# Patient Record
Sex: Female | Born: 1995 | Hispanic: Yes | Marital: Married | Smoking: Never smoker
Health system: Southern US, Community
[De-identification: ages and names within clinical notes are randomized; demographics above are authoritative.]

## PROBLEM LIST (undated history)

## (undated) DIAGNOSIS — O24419 Gestational diabetes mellitus in pregnancy, unspecified control: Secondary | ICD-10-CM

## (undated) DIAGNOSIS — Z789 Other specified health status: Secondary | ICD-10-CM

## (undated) DIAGNOSIS — E119 Type 2 diabetes mellitus without complications: Secondary | ICD-10-CM

## (undated) HISTORY — DX: Gestational diabetes mellitus in pregnancy, unspecified control: O24.419

## (undated) HISTORY — DX: Type 2 diabetes mellitus without complications: E11.9

## (undated) HISTORY — PX: NO PAST SURGERIES: SHX2092

## (undated) HISTORY — DX: Other specified health status: Z78.9

---

## 2018-05-26 LAB — OB RESULTS CONSOLE RUBELLA ANTIBODY, IGM: Rubella: IMMUNE

## 2018-05-26 LAB — OB RESULTS CONSOLE HIV ANTIBODY (ROUTINE TESTING): HIV: NONREACTIVE

## 2018-05-26 LAB — OB RESULTS CONSOLE RPR: RPR: NONREACTIVE

## 2018-05-26 LAB — OB RESULTS CONSOLE HEPATITIS B SURFACE ANTIGEN: HEP B S AG: NEGATIVE

## 2018-05-28 LAB — OB RESULTS CONSOLE GC/CHLAMYDIA
Chlamydia: NEGATIVE
Gonorrhea: NEGATIVE

## 2018-06-24 DIAGNOSIS — Z3401 Encounter for supervision of normal first pregnancy, first trimester: Secondary | ICD-10-CM | POA: Diagnosis not present

## 2018-06-24 DIAGNOSIS — Z1389 Encounter for screening for other disorder: Secondary | ICD-10-CM | POA: Diagnosis not present

## 2018-07-08 DIAGNOSIS — Z3401 Encounter for supervision of normal first pregnancy, first trimester: Secondary | ICD-10-CM | POA: Diagnosis not present

## 2018-12-14 NOTE — L&D Delivery Note (Signed)
Delivery Note  Date of delivery: 01/25/2019 Estimated Date of Delivery: 01/16/19 Patient's last menstrual period was 04/13/2018 (approximate). EGA: 2921w2d  First Stage: Induction: pitocin, cook baloon Augmentation: AROM Analgesia Katrina Hester: IVPM, epidrual AROM at 1300 01/24/2019  Katrina PaganiniJennifer Hester presented to L&D for induction of labor of late term pregnancy. She was induced with pitocin and a Cook balloon, then augmented with AROM. Epidural placed for pain relief.   Second Stage: Complete dilation at 0228 Onset of pushing at 0343 FHR second stage: category II Delivery at 0533 on 01/25/2019  She progressed to complete and had a spontaneous vaginal birth of a live female over an intact perineum. The fetal head was delivered in direct OA position with restitution to ROA. No nuchal cord. Anterior then posterior shoulders delivered with minimal assistance. Baby placed on mom's abdomen and attended to by transition RN. Cord clamped and cut after two minute delay by father of the baby. Cord blood obtained for newborn labs.  Third Stage: Placenta delivered spontaneously intact with 3VC at 0539 Placenta disposition: routine disposal Uterine tone firm / bleeding scant IV pitocin given for hemorrhage prophylaxis  2nd degree perineal and left labial laceration identified  Anesthesia for repair: epidural, lidocaine Repair: 3-0 Vicryl Rapide CT, 3-0 Vicryl SH Est. Blood Loss (mL): 750  Complications: none  Mom to postpartum.  Baby to Couplet care / Skin to Skin.  Newborn: Birth Weight: pending  Apgar Scores: 8, 9 Feeding planned: breast   Genia DelMargaret Greely Hester, CNM 01/25/2019 6:28 AM

## 2019-01-23 ENCOUNTER — Observation Stay
Admission: EM | Admit: 2019-01-23 | Discharge: 2019-01-23 | Disposition: A | Discharge status: Home / Self Care | Payer: Medicaid Other | Attending: Obstetrics and Gynecology | Admitting: Obstetrics and Gynecology

## 2019-01-23 ENCOUNTER — Encounter: Payer: Self-pay | Admitting: *Deleted

## 2019-01-23 DIAGNOSIS — Z3A41 41 weeks gestation of pregnancy: Secondary | ICD-10-CM | POA: Diagnosis not present

## 2019-01-23 DIAGNOSIS — Z3490 Encounter for supervision of normal pregnancy, unspecified, unspecified trimester: Secondary | ICD-10-CM

## 2019-01-23 DIAGNOSIS — O471 False labor at or after 37 completed weeks of gestation: Secondary | ICD-10-CM | POA: Diagnosis present

## 2019-01-23 DIAGNOSIS — O480 Post-term pregnancy: Secondary | ICD-10-CM | POA: Diagnosis not present

## 2019-01-23 NOTE — Discharge Instructions (Signed)
Return on 01/24/2019 at 0001 for IOL.

## 2019-01-23 NOTE — Discharge Summary (Signed)
41 +0 week  Presented with uterine contractions . Scheduled for induction in next 24 hrs. Pt cx was 2 cm  And ctx q 2-7 min . Reassuring fetal monitoring . D/C home

## 2019-01-23 NOTE — OB Triage Note (Signed)
Pt arrived from home with complaints of contractions all day and leaking fluid that started about 30 min ago. Pt recieves prenatal care at [facility] and is scheduled for IOL on Tuesday 2/11. Pt reports feeling baby move and denies any vaginal bleeding. EFM and TOCO applied.

## 2019-01-23 NOTE — Discharge Summary (Signed)
nst FHR 150 + accels , no decels . Ctx q 2-7 min  Reactive NST

## 2019-01-23 NOTE — Progress Notes (Signed)
Dr Feliberto Gottron notified of pt arrival to unit and complaint. MD aware of reactive FHR tracing and contraction pattern. MD notified of SVE. Orders received to continue to monitor pt for an hour and re check cervix, if no change pt may be discharged to return for IOL in 24 hour

## 2019-01-24 ENCOUNTER — Inpatient Hospital Stay: Payer: Medicaid Other | Admitting: Anesthesiology

## 2019-01-24 ENCOUNTER — Inpatient Hospital Stay
Admission: RE | Admit: 2019-01-24 | Discharge: 2019-01-27 | DRG: 806 | Disposition: A | Discharge status: Home / Self Care | Payer: Medicaid Other | Attending: Certified Nurse Midwife | Admitting: Certified Nurse Midwife

## 2019-01-24 ENCOUNTER — Other Ambulatory Visit: Payer: Self-pay

## 2019-01-24 DIAGNOSIS — O480 Post-term pregnancy: Principal | ICD-10-CM | POA: Diagnosis present

## 2019-01-24 DIAGNOSIS — D62 Acute posthemorrhagic anemia: Secondary | ICD-10-CM | POA: Diagnosis not present

## 2019-01-24 DIAGNOSIS — O43123 Velamentous insertion of umbilical cord, third trimester: Secondary | ICD-10-CM | POA: Diagnosis present

## 2019-01-24 DIAGNOSIS — Z3A41 41 weeks gestation of pregnancy: Secondary | ICD-10-CM

## 2019-01-24 DIAGNOSIS — O9912 Other diseases of the blood and blood-forming organs and certain disorders involving the immune mechanism complicating childbirth: Secondary | ICD-10-CM | POA: Diagnosis present

## 2019-01-24 DIAGNOSIS — Z3493 Encounter for supervision of normal pregnancy, unspecified, third trimester: Secondary | ICD-10-CM

## 2019-01-24 DIAGNOSIS — D72829 Elevated white blood cell count, unspecified: Secondary | ICD-10-CM | POA: Diagnosis present

## 2019-01-24 DIAGNOSIS — O9081 Anemia of the puerperium: Secondary | ICD-10-CM | POA: Diagnosis not present

## 2019-01-24 LAB — CBC
HEMATOCRIT: 32.2 % — AB (ref 36.0–46.0)
Hemoglobin: 10.7 g/dL — ABNORMAL LOW (ref 12.0–15.0)
MCH: 30.7 pg (ref 26.0–34.0)
MCHC: 33.2 g/dL (ref 30.0–36.0)
MCV: 92.5 fL (ref 80.0–100.0)
Platelets: 200 10*3/uL (ref 150–400)
RBC: 3.48 MIL/uL — ABNORMAL LOW (ref 3.87–5.11)
RDW: 13.2 % (ref 11.5–15.5)
WBC: 9.6 10*3/uL (ref 4.0–10.5)
nRBC: 0 % (ref 0.0–0.2)

## 2019-01-24 LAB — OB RESULTS CONSOLE GBS: GBS: NEGATIVE

## 2019-01-24 LAB — RAPID HIV SCREEN (HIV 1/2 AB+AG)
HIV 1/2 Antibodies: NONREACTIVE
HIV-1 P24 Antigen - HIV24: NONREACTIVE

## 2019-01-24 LAB — TYPE AND SCREEN
ABO/RH(D): O POS
ANTIBODY SCREEN: NEGATIVE

## 2019-01-24 LAB — OB RESULTS CONSOLE HIV ANTIBODY (ROUTINE TESTING): HIV: NONREACTIVE

## 2019-01-24 LAB — GROUP B STREP BY PCR: Group B strep by PCR: NEGATIVE

## 2019-01-24 MED ORDER — OXYTOCIN 40 UNITS IN NORMAL SALINE INFUSION - SIMPLE MED
2.5000 [IU]/h | INTRAVENOUS | Status: DC
  Administered 2019-01-25: 2.5 [IU]/h via INTRAVENOUS
  Filled 2019-01-24: qty 1000

## 2019-01-24 MED ORDER — AMMONIA AROMATIC IN INHA
RESPIRATORY_TRACT | Status: AC
  Filled 2019-01-24: qty 10

## 2019-01-24 MED ORDER — LIDOCAINE HCL (PF) 1 % IJ SOLN
30.0000 mL | INTRAMUSCULAR | Status: DC | PRN
  Administered 2019-01-25: 30 mL via SUBCUTANEOUS

## 2019-01-24 MED ORDER — SOD CITRATE-CITRIC ACID 500-334 MG/5ML PO SOLN: 30.0000 mL | ORAL | Status: DC | PRN

## 2019-01-24 MED ORDER — OXYTOCIN 40 UNITS IN NORMAL SALINE INFUSION - SIMPLE MED
1.0000 m[IU]/min | INTRAVENOUS | Status: DC
  Administered 2019-01-24: 2 m[IU]/min via INTRAVENOUS
  Filled 2019-01-24: qty 1000

## 2019-01-24 MED ORDER — FENTANYL 2.5 MCG/ML W/ROPIVACAINE 0.15% IN NS 100 ML EPIDURAL (ARMC)
EPIDURAL | Status: DC | PRN
  Administered 2019-01-24: 12 mL/h via EPIDURAL

## 2019-01-24 MED ORDER — LIDOCAINE HCL (PF) 1 % IJ SOLN
INTRAMUSCULAR | Status: DC | PRN
  Administered 2019-01-24: 3 mL

## 2019-01-24 MED ORDER — MISOPROSTOL 200 MCG PO TABS
ORAL_TABLET | ORAL | Status: AC
  Filled 2019-01-24: qty 4

## 2019-01-24 MED ORDER — OXYTOCIN BOLUS FROM INFUSION
500.0000 mL | Freq: Once | INTRAVENOUS | Status: AC
  Administered 2019-01-25: 500 mL via INTRAVENOUS

## 2019-01-24 MED ORDER — LIDOCAINE-EPINEPHRINE (PF) 1.5 %-1:200000 IJ SOLN
INTRAMUSCULAR | Status: DC | PRN
  Administered 2019-01-24: 3 mL via PERINEURAL

## 2019-01-24 MED ORDER — OXYTOCIN 10 UNIT/ML IJ SOLN
INTRAMUSCULAR | Status: AC
  Filled 2019-01-24: qty 2

## 2019-01-24 MED ORDER — LACTATED RINGERS IV SOLN
500.0000 mL | INTRAVENOUS | Status: DC | PRN
  Administered 2019-01-24: 500 mL via INTRAVENOUS

## 2019-01-24 MED ORDER — ACETAMINOPHEN 325 MG PO TABS: 650.0000 mg | ORAL_TABLET | ORAL | Status: DC | PRN

## 2019-01-24 MED ORDER — LIDOCAINE HCL (PF) 1 % IJ SOLN
INTRAMUSCULAR | Status: AC
  Administered 2019-01-25: 30 mL via SUBCUTANEOUS
  Filled 2019-01-24: qty 30

## 2019-01-24 MED ORDER — FENTANYL 2.5 MCG/ML W/ROPIVACAINE 0.15% IN NS 100 ML EPIDURAL (ARMC)
EPIDURAL | Status: AC
  Filled 2019-01-24: qty 100

## 2019-01-24 MED ORDER — BUPIVACAINE HCL (PF) 0.25 % IJ SOLN
INTRAMUSCULAR | Status: DC | PRN
  Administered 2019-01-24: 5 mL via EPIDURAL
  Administered 2019-01-24 (×2): 4 mL via EPIDURAL
  Administered 2019-01-24: 5 mL via EPIDURAL

## 2019-01-24 MED ORDER — BUTORPHANOL TARTRATE 1 MG/ML IJ SOLN
1.0000 mg | INTRAMUSCULAR | Status: DC | PRN
  Administered 2019-01-24 (×2): 1 mg via INTRAVENOUS
  Filled 2019-01-24 (×2): qty 1

## 2019-01-24 MED ORDER — TERBUTALINE SULFATE 1 MG/ML IJ SOLN: 0.2500 mg | Freq: Once | INTRAMUSCULAR | Status: DC | PRN

## 2019-01-24 MED ORDER — LACTATED RINGERS IV SOLN
INTRAVENOUS | Status: DC
  Administered 2019-01-24 (×2): via INTRAVENOUS

## 2019-01-24 MED ORDER — ONDANSETRON HCL 4 MG/2ML IJ SOLN
4.0000 mg | Freq: Four times a day (QID) | INTRAMUSCULAR | Status: DC | PRN
  Administered 2019-01-25: 4 mg via INTRAVENOUS
  Filled 2019-01-24: qty 2

## 2019-01-24 NOTE — Anesthesia Preprocedure Evaluation (Signed)
Anesthesia Evaluation  Patient identified by MRN, date of birth, ID band Patient awake    Reviewed: Allergy & Precautions, H&P , NPO status , Patient's Chart, lab work & pertinent test results  History of Anesthesia Complications Negative for: history of anesthetic complications  Airway Mallampati: II  TM Distance: >3 FB Neck ROM: full    Dental no notable dental hx.    Pulmonary neg pulmonary ROS,    Pulmonary exam normal        Cardiovascular negative cardio ROS Normal cardiovascular exam     Neuro/Psych negative neurological ROS  negative psych ROS   GI/Hepatic negative GI ROS, Neg liver ROS,   Endo/Other  negative endocrine ROS  Renal/GU negative Renal ROS  negative genitourinary   Musculoskeletal   Abdominal   Peds  Hematology negative hematology ROS (+)   Anesthesia Other Findings   Reproductive/Obstetrics (+) Pregnancy                             Anesthesia Physical Anesthesia Plan  ASA: II  Anesthesia Plan: Epidural   Post-op Pain Management:    Induction:   PONV Risk Score and Plan:   Airway Management Planned:   Additional Equipment:   Intra-op Plan:   Post-operative Plan:   Informed Consent: I have reviewed the patients History and Physical, chart, labs and discussed the procedure including the risks, benefits and alternatives for the proposed anesthesia with the patient or authorized representative who has indicated his/her understanding and acceptance.       Plan Discussed with: Anesthesiologist  Anesthesia Plan Comments:         Anesthesia Quick Evaluation  

## 2019-01-24 NOTE — Plan of Care (Signed)
Pt presents from home with plan for induction of labor at 41/1. Pt is in agreement at this time. Family and spouse at bedside for support. Expecting baby girl "Sophia".

## 2019-01-24 NOTE — Progress Notes (Signed)
   01/24/19 1000  Clinical Encounter Type  Visited With Patient and family together  Visit Type Initial;Spiritual support  Referral From Chaplain  Consult/Referral To Chaplain  Spiritual Encounters  Spiritual Needs Emotional  Chaplain was rounding and stop to visit patient, she introduced herself to patient and boyfriend Jari Favre. There were other family at bedside asleep. Chaplain told patient she just want to stop and wish her well on her delivery and prays everything goes well and be as confronting as possible. Patient really appreciated the visit and thanked the Chaplain. Chaplain said your welcomed and left.

## 2019-01-24 NOTE — H&P (Signed)
OB History & Physical   History of Present Illness:  Chief Complaint: Presents for IOL  HPI:  Katrina Hester is a 23 y.o. G1P0 female at [redacted]w[redacted]d dated by [redacted]w[redacted]d ultrasound.  She presents to L&D for planned induction for late term pregnancy. Prenatal records were NOT available and the writing of this note.   She reports:  -active fetal movement -no leakage of fluid -no vaginal bleeding  Pregnancy Issues: Records not available   Maternal Medical History:  History reviewed. No pertinent past medical history.  History reviewed. No pertinent surgical history.  No Known Allergies  Prior to Admission medications   Medication Sig Start Date End Date Taking? Authorizing Provider  prenatal vitamin w/FE, FA (PRENATAL 1 + 1) 27-1 MG TABS tablet Take 1 tablet by mouth daily at 12 noon.   Yes [provider]     Prenatal care site: [facility]  Social History: She  reports that she has never smoked. She has never used smokeless tobacco. She reports that she does not drink alcohol or use drugs.  Family History: family history is not on file.   Review of Systems: A full review of systems was performed and negative except as noted in the HPI.    Physical Exam:  Vital Signs: BP (!) 110/57 (BP Location: Left Arm)   Pulse 77   Temp 98 F (36.7 C) (Oral)   Resp 18   Ht 5\' 1"  (1.549 m)   Wt 72.1 kg   LMP 04/13/2018 (Approximate)   BMI 30.04 kg/m   General:   alert, cooperative, appears stated age and no distress  Skin:  normal and no rash or abnormalities  Neurologic:    Alert & oriented x 3  Lungs:   clear to auscultation bilaterally  Heart:   regular rate and rhythm, S1, S2 normal, no murmur, click, rub or gallop  Abdomen:  soft, non-tender; bowel sounds normal; no masses,  no organomegaly  Pelvis:  External genitalia: normal general appearance  FHT:  140 BPM  Presentations: cephalic  Cervix:     Dilation: 3cm   Effacement: 70%   Station:  -2   Position: posterior  Extremities: : non-tender, symmetric, no edema bilaterally.    EFW: 8lb  Results for orders placed or performed during the hospital encounter of 01/24/19 (from the past 24 hour(s))  CBC     Status: Abnormal   Collection Time: 01/24/19 12:53 AM  Result Value Ref Range   WBC 9.6 4.0 - 10.5 K/uL   RBC 3.48 (L) 3.87 - 5.11 MIL/uL   Hemoglobin 10.7 (L) 12.0 - 15.0 g/dL   HCT 43.1 (L) 54.0 - 08.6 %   MCV 92.5 80.0 - 100.0 fL   MCH 30.7 26.0 - 34.0 pg   MCHC 33.2 30.0 - 36.0 g/dL   RDW 76.1 95.0 - 93.2 %   Platelets 200 150 - 400 K/uL   nRBC 0.0 0.0 - 0.2 %  Type and screen [facility]     Status: None   Collection Time: 01/24/19 12:53 AM  Result Value Ref Range   ABO/RH(D) O POS    Antibody Screen NEG    Sample Expiration      01/27/2019 Performed at [facility], [address] [place], [place] [address]   Group B strep by PCR     Status: None   Collection Time: 01/24/19 12:53 AM  Result Value Ref Range   Group B strep by PCR NEGATIVE  NEGATIVE  Rapid HIV screen (HIV 1/2 Ab+Ag) ([facility] Only)     Status: None   Collection Time: 01/24/19 12:53 AM  Result Value Ref Range   HIV-1 P24 Antigen - HIV24 NON REACTIVE NON REACTIVE   HIV 1/2 Antibodies NON REACTIVE NON REACTIVE   Interpretation (HIV Ag Ab)      A non reactive test result means that HIV 1 or HIV 2 antibodies and HIV 1 p24 antigen were not detected in the specimen.    Pertinent Results:  Prenatal Labs: Blood type/Rh O+  Antibody screen neg  Rubella unknown  Varicella unknown  RPR unknown  HBsAg unknown  HIV NR  GC unknown  Chlamydia unknown  Genetic screening unknown  1 hour GTT unknown  3 hour GTT unknown  GBS negative   FHT: FHR: 130 bpm, variability: moderate,  accelerations:  Present,  decelerations:  Absent Category/reactivity:  Category I TOCO: regular, every 1.5-5 minutes  Assessment:  Katrina Hester is a 23 y.o. G1P0 female at [redacted]w[redacted]d with late term  pregnancy for induction of labor.   Plan:  1. Admit to Labor & Delivery; consents reviewed and obtained  2. Fetal Well being  - Fetal Tracing: category I - GBS negative - Presentation: cephalic confirmed by vaginal exam   3. Routine OB: - Prenatal labs requested - Rh positive - CBC & T&S on admit - Clear fluids, IVF  4. Induction of Labor: -  Contractions to be monitored with external toco in place -  Pelvis unproven -  Induction already started with pitocin. Cook balloon just placed with 79mL uterine and 39mL vaginal -  Plan for continuous fetal monitoring  -  Maternal pain control as desired: IVPM, nitrous, regional anesthesia -  Anticipate vaginal delivery   [name], CNM 01/24/2019 8:35 AM ----- [name] Certified Nurse Midwife [facility], Department of OB/GYN [facility]

## 2019-01-24 NOTE — Progress Notes (Signed)
Labor Progress Note  Katrina Hester is a 23 y.o. G1P0 at [redacted]w[redacted]d by [redacted]w[redacted]d ultrasound admitted for induction of labor due to late term pregnancy.  Subjective:  Still feeling comfortable. Not feeling contractions.   Objective: BP 126/78 (BP Location: Left Arm)   Pulse 69   Temp 98.2 F (36.8 C) (Oral)   Resp 18   Ht 5\' 1"  (1.549 m)   Wt 72.1 kg   LMP 04/13/2018 (Approximate)   BMI 30.04 kg/m   Fetal Assessment: FHT:  FHR: 145 bpm, variability: moderate,  accelerations:  Present,  decelerations:  Absent Category/reactivity:  Category I UC:   regular, every 2-4 minutes SVE:    Dilation: 4-5cm  Effacement: 80%  Station:  -2  Consistency: soft  Position: middle  Membrane status: AROM 1300 01/24/2019 Amniotic color: clear  Labs: Lab Results  Component Value Date   WBC 9.6 01/24/2019   HGB 10.7 (L) 01/24/2019   HCT 32.2 (L) 01/24/2019   MCV 92.5 01/24/2019   PLT 200 01/24/2019    Assessment / Plan: Induction of labor for late term pregnancy  Labor: Pitocin started at 0130, currently infusing at 75mu/min. S/p AROM now, IUPC placed. Plan to continue to titrate per protocol to adequate MVUs.  Fetal Wellbeing:  Category I Pain Control:  As maternally desired: IVPM, nitrous oxide, regional anesthesia  Anticipated MOD:  NSVD  [name], CNM 01/24/2019, 3:00 PM

## 2019-01-24 NOTE — Progress Notes (Signed)
Labor Progress Note  Katrina Hester is a 23 y.o. G1P0 at [redacted]w[redacted]d by [redacted]w[redacted]d ultrasound admitted for induction of labor due to late term pregnancy.  Subjective:  Still feeling comfortable. Not feeling contractions.   Objective: BP 126/78 (BP Location: Left Arm)   Pulse 69   Temp 98.8 F (37.1 C) (Oral)   Resp 18   Ht 5\' 1"  (1.549 m)   Wt 72.1 kg   LMP 04/13/2018 (Approximate)   BMI 30.04 kg/m   Fetal Assessment: FHT:  FHR: 140 bpm, variability: moderate,  accelerations:  Present,  decelerations:  Absent Category/reactivity:  Category I UC:   regular, every 2-4 minutes SVE:    Dilation: 4cm  Effacement: 70%  Station:  -2  Consistency: soft  Position: posterior  Membrane status: AROM 1300 01/24/2019 Amniotic color: clear  Labs: Lab Results  Component Value Date   WBC 9.6 01/24/2019   HGB 10.7 (L) 01/24/2019   HCT 32.2 (L) 01/24/2019   MCV 92.5 01/24/2019   PLT 200 01/24/2019    Assessment / Plan: Induction of labor for late term pregnancy  Labor: Pitocin started at 0130, currently infusing at 58mu/min. S/p AROM now, IUPC placed. Plan to continue to titrate per protocol to adequate MVUs.  Fetal Wellbeing:  Category I Pain Control:  As maternally desired: IVPM, nitrous oxide, regional anesthesia  Anticipated MOD:  NSVD  [name], CNM 01/24/2019, 1:05 PM

## 2019-01-24 NOTE — Progress Notes (Signed)
Labor Progress Note  Katrina Hester is a 23 y.o. G1P0 at 8058w1d by 2488w1d ultrasound admitted for induction of labor due to late term pregnancy.  Subjective:  Still feeling comfortable. Not feeling contractions.   Objective: BP (!) 143/78 (BP Location: Left Arm)   Pulse 77   Temp 98.2 F (36.8 C) (Oral)   Resp 18   Ht 5\' 1"  (1.549 m)   Wt 72.1 kg   LMP 04/13/2018 (Approximate)   SpO2 100%   BMI 30.04 kg/m   Fetal Assessment: FHT:  FHR: 145 bpm, variability: moderate,  accelerations:  Present,  decelerations:  Present early, variable Category/reactivity:  Category II UC:   regular, every 2-3 minutes SVE:    Dilation: 6cm  Effacement: 90%  Station:  -1  Consistency: soft  Position: middle  Membrane status: AROM 1300 01/24/2019 Amniotic color: clear   Labs: Lab Results  Component Value Date   WBC 9.6 01/24/2019   HGB 10.7 (L) 01/24/2019   HCT 32.2 (L) 01/24/2019   MCV 92.5 01/24/2019   PLT 200 01/24/2019    Assessment / Plan: Induction of labor for late term pregnancy  Labor: Pitocin started at 0130, currently infusing at 2026mu/min. Adequate MVUs since 1400.  Fetal Wellbeing:  Category II for occasional variable decels, overall reassuring Pain Control:  As maternally desired: IVPM, nitrous oxide, regional anesthesia  Anticipated MOD:  NSVD  Katrina Hester, CNM 01/24/2019, 6:51 PM

## 2019-01-24 NOTE — Anesthesia Procedure Notes (Signed)
Epidural Patient location during procedure: OB Start time: 01/24/2019 4:58 PM End time: 01/24/2019 5:01 PM  Staffing Anesthesiologist: Jovita Gamma, MD Resident/CRNA: Stormy Fabian, CRNA Performed: resident/CRNA   Preanesthetic Checklist Completed: patient identified, site marked, surgical consent, pre-op evaluation, timeout performed, IV checked, risks and benefits discussed and monitors and equipment checked  Epidural Patient position: sitting Prep: ChloraPrep Patient monitoring: heart rate, continuous pulse ox and blood pressure Approach: midline Location: L3-L4 Injection technique: LOR saline  Needle:  Needle type: Tuohy  Needle gauge: 17 G Needle length: 9 cm and 9 Needle insertion depth: 5.5 cm Catheter type: closed end flexible Catheter size: 19 Gauge Catheter at skin depth: 11 cm Test dose: negative and 1.5% lidocaine with Epi 1:200 K  Assessment Sensory level: T10 Events: blood not aspirated, injection not painful, no injection resistance, negative IV test and no paresthesia  Additional Notes 1 attempt Pt. Evaluated and documentation done after procedure finished. Patient identified. Risks/Benefits/Options discussed with patient including but not limited to bleeding, infection, nerve damage, paralysis, failed block, incomplete pain control, headache, blood pressure changes, nausea, vomiting, reactions to medication both or allergic, itching and postpartum back pain. Confirmed with bedside nurse the patient's most recent platelet count. Confirmed with patient that they are not currently taking any anticoagulation, have any bleeding history or any family history of bleeding disorders. Patient expressed understanding and wished to proceed. All questions were answered. Sterile technique was used throughout the entire procedure. Please see nursing notes for vital signs. Test dose was given through epidural catheter and negative prior to continuing to dose epidural or  start infusion. Warning signs of high block given to the patient including shortness of breath, tingling/numbness in hands, complete motor block, or any concerning symptoms with instructions to call for help. Patient was given instructions on fall risk and not to get out of bed. All questions and concerns addressed with instructions to call with any issues or inadequate analgesia.   Patient tolerated the insertion well without immediate complications.Reason for block:procedure for pain

## 2019-01-24 NOTE — Progress Notes (Signed)
Labor Progress Note  Kynzlie Starkel is a 23 y.o. G1P0 at [redacted]w[redacted]d by [redacted]w[redacted]d ultrasound admitted for induction of labor due to late term pregnancy.  Subjective:  Still feeling comfortable, has epidural in place.  Objective: BP 102/70   Pulse 100   Temp 98.3 F (36.8 C) (Oral)   Resp 18   Ht 5\' 1"  (1.549 m)   Wt 72.1 kg   LMP 04/13/2018 (Approximate)   SpO2 94%   BMI 30.04 kg/m   Fetal Assessment: FHT:  FHR: 155 bpm, variability: moderate, marked,  accelerations:  Present,  decelerations:  Present early, variable Category/reactivity:  Category II UC:   regular, every 2-3 minutes SVE:    Dilation: 8cm  Effacement: 90%  Station:  -1  Consistency: soft  Position: middle  Membrane status: AROM 1300 01/24/2019 Amniotic color: clear   Labs: Lab Results  Component Value Date   WBC 9.6 01/24/2019   HGB 10.7 (L) 01/24/2019   HCT 32.2 (L) 01/24/2019   MCV 92.5 01/24/2019   PLT 200 01/24/2019    Assessment / Plan: Induction of labor for late term pregnancy  Labor: Pitocin started at 0130, currently infusing at 57mu/min. Adequate MVUs from 1400 to 1900. MVUs 185 over last 10 minutes. Plan to titrate pitocin per protocol to adequate MVUs.  Fetal Wellbeing:  Category II for occasional variable decels, overall reassuring Pain Control:  As maternally desired: IVPM, nitrous oxide, regional anesthesia  Anticipated MOD:  NSVD  [name], CNM 01/24/2019, 10:22 PM

## 2019-01-25 LAB — RPR: RPR Ser Ql: NONREACTIVE

## 2019-01-25 MED ORDER — INFLUENZA VAC SPLIT QUAD 0.5 ML IM SUSY: 0.5000 mL | PREFILLED_SYRINGE | INTRAMUSCULAR | Status: DC

## 2019-01-25 MED ORDER — FERROUS SULFATE 325 (65 FE) MG PO TABS
325.0000 mg | ORAL_TABLET | Freq: Two times a day (BID) | ORAL | Status: DC
  Administered 2019-01-25 – 2019-01-27 (×4): 325 mg via ORAL
  Filled 2019-01-25 (×4): qty 1

## 2019-01-25 MED ORDER — COCONUT OIL OIL: 1.0000 "application " | TOPICAL_OIL | Status: DC | PRN

## 2019-01-25 MED ORDER — ACETAMINOPHEN 325 MG PO TABS: 650.0000 mg | ORAL_TABLET | ORAL | Status: DC | PRN

## 2019-01-25 MED ORDER — SENNOSIDES-DOCUSATE SODIUM 8.6-50 MG PO TABS
2.0000 | ORAL_TABLET | ORAL | Status: DC
  Administered 2019-01-26: 2 via ORAL
  Filled 2019-01-25: qty 2

## 2019-01-25 MED ORDER — PRENATAL MULTIVITAMIN CH
1.0000 | ORAL_TABLET | Freq: Every day | ORAL | Status: DC
  Administered 2019-01-25 – 2019-01-26 (×2): 1 via ORAL
  Filled 2019-01-25 (×2): qty 1

## 2019-01-25 MED ORDER — DIBUCAINE 1 % RE OINT: 1.0000 "application " | TOPICAL_OINTMENT | RECTAL | Status: DC | PRN

## 2019-01-25 MED ORDER — BENZOCAINE-MENTHOL 20-0.5 % EX AERO
1.0000 "application " | INHALATION_SPRAY | CUTANEOUS | Status: DC | PRN
  Administered 2019-01-27: 1 via TOPICAL
  Filled 2019-01-25: qty 56

## 2019-01-25 MED ORDER — WITCH HAZEL-GLYCERIN EX PADS: 1.0000 "application " | MEDICATED_PAD | CUTANEOUS | Status: DC | PRN

## 2019-01-25 MED ORDER — IBUPROFEN 600 MG PO TABS
600.0000 mg | ORAL_TABLET | Freq: Four times a day (QID) | ORAL | Status: DC
  Administered 2019-01-25 – 2019-01-27 (×9): 600 mg via ORAL
  Filled 2019-01-25 (×9): qty 1

## 2019-01-25 MED ORDER — ONDANSETRON HCL 4 MG/2ML IJ SOLN: 4.0000 mg | INTRAMUSCULAR | Status: DC | PRN

## 2019-01-25 MED ORDER — BENZOCAINE-MENTHOL 20-0.5 % EX AERO
INHALATION_SPRAY | CUTANEOUS | Status: AC
  Filled 2019-01-25: qty 56

## 2019-01-25 MED ORDER — DIPHENHYDRAMINE HCL 25 MG PO CAPS: 25.0000 mg | ORAL_CAPSULE | Freq: Four times a day (QID) | ORAL | Status: DC | PRN

## 2019-01-25 MED ORDER — SIMETHICONE 80 MG PO CHEW: 160.0000 mg | CHEWABLE_TABLET | ORAL | Status: DC | PRN

## 2019-01-25 MED ORDER — ONDANSETRON HCL 4 MG PO TABS: 4.0000 mg | ORAL_TABLET | ORAL | Status: DC | PRN

## 2019-01-25 NOTE — Progress Notes (Signed)
Labor Progress Note  Katrina Hester is a 23 y.o. G1P0 at [redacted]w[redacted]d by [redacted]w[redacted]d ultrasound admitted for induction of labor due to late term pregnancy.  Subjective:  Feeling pressure in her bottom.   Objective: BP 136/84   Pulse 90   Temp (!) 100.6 F (38.1 C) (Oral)   Resp 18   Ht 5\' 1"  (1.549 m)   Wt 72.1 kg   LMP 04/13/2018 (Approximate)   SpO2 97%   BMI 30.04 kg/m   Fetal Assessment: FHT:  FHR: 150 bpm, variability: minimal, moderate,  accelerations:  Present,  decelerations:  Present early, variable Category/reactivity:  Category II UC:   regular, every 2-3 minutes SVE:    Dilation: complete  Effacement: complete  Station:  +1 to +2 with pushing  Consistency: soft  Position: middle  Membrane status: AROM 1300 01/24/2019 Amniotic color: clear   Labs: Lab Results  Component Value Date   WBC 9.6 01/24/2019   HGB 10.7 (L) 01/24/2019   HCT 32.2 (L) 01/24/2019   MCV 92.5 01/24/2019   PLT 200 01/24/2019    Assessment / Plan: Induction of labor for late term pregnancy  Labor: Pitocin started at 0130, currently infusing at 82mu/min. Dilation complete at 0228. Patient did one hour of laboring down, now pushing with good effort for 40 minutes. Continue pushing.  Fetal Wellbeing:  Category II for occasional variable decels and periods of minimal variability, overall reassuring Pain Control:  Epidural Anticipated MOD:  NSVD  [name], CNM 01/25/2019, 4:24 AM

## 2019-01-25 NOTE — Discharge Summary (Signed)
Obstetric Discharge Summary   Patient Name: Katrina Hester DOB: 17-Nov-1996 MRN: [id number]  Date of Admission: 01/24/2019 Date of Delivery: 01/25/2019 Delivered by: [name] Date of Discharge: 01/27/2019  Primary OB: [facility]  [facility] last menstrual period was 04/13/2018 (approximate). EDC Estimated Date of Delivery: 01/16/19 Gestational Age at Delivery: [redacted]w[redacted]d   Antepartum complications:  1. Anemia on iron supplement 2. Pyelonephritis in 09/2018  Admitting Diagnosis: planned induction of labor  Secondary Diagnoses: Patient Active Problem List   Diagnosis Date Noted  . Supervision of normal pregnancy in third trimester 01/24/2019  . Pregnancy 01/23/2019    Induction/Augmentation: AROM, Pitocin and Cook balloon Complications: None Intrapartum complications/course: Katrina Hester presented to L&D for induction of labor of late term pregnancy. She was induced with pitocin and a Cook balloon, then augmented with AROM. Epidural placed for pain relief. She progressed to complete and had a spontaneous vaginal birth of a live female over an intact perineum. The fetal head was delivered in direct OA position with restitution to ROA. No nuchal cord. Anterior then posterior shoulders delivered with minimal assistance. Baby placed on mom's abdomen and attended to by transition RN. Cord clamped and cut after two minute delay by father of the baby. Cord blood obtained for newborn labs. Placenta delivered spontaneously intact with 3VC at 0539. Marginal cord insertion noted. IV pitocin given for hemorrhage prophylaxis. 2nd degree perineal and left labial laceration identified and repaired in the usual fashion.   Delivery Type: spontaneous vaginal delivery Anesthesia: epidural Placenta: spontaneous Laceration: 2nd degree perineal, left labial Episiotomy: none  Newborn Data: Live born female "Sophia" Birth Weight: 4020 APGAR: 8, 9  Newborn Delivery    Birth date/time:  01/25/2019 05:33:00 Delivery type:  Vaginal, Spontaneous      Postpartum Course  Patient had an uncomplicated postpartum course.  By time of discharge on PPD#2, her pain was controlled on oral pain medications; she had appropriate lochia and was ambulating, voiding without difficulty and tolerating regular diet.  She was deemed stable for discharge to home.    Anemia: discharge home with iron     Labs: CBC Latest Ref Rng & Units 01/27/2019 01/26/2019 01/24/2019  WBC 4.0 - 10.5 K/uL 17.5(H) 19.6(H) 9.6  Hemoglobin 12.0 - 15.0 g/dL 8.3(L) 8.1(L) 10.7(L)  Hematocrit 36.0 - 46.0 % 25.3(L) 24.7(L) 32.2(L)  Platelets 150 - 400 K/uL 219 161 200   O POS  Physical exam:  BP 122/83 (BP Location: Right Arm)   Pulse 79   Temp 98.3 F (36.8 C) (Oral)   Resp 18   Ht 5\' 1"  (1.549 m)   Wt 72.1 kg   LMP 04/13/2018 (Approximate)   SpO2 100%   Breastfeeding Unknown   BMI 30.04 kg/m  General: alert and no distress Pulm: normal respiratory effort Lochia: appropriate Abdomen: soft, NT Uterine Fundus: firm, below umbilicus Extremities: No evidence of DVT seen on physical exam. No lower extremity edema.   Disposition: stable, discharge to home Baby Feeding: breastmilk Baby Disposition: home with mom  Contraception: undecided  Prenatal Labs:  Blood type/Rh O+  Antibody screen neg  Rubella immune  Varicella immune  RPR NR  HBsAg negative  HIV NR  GC negative  Chlamydia negative  Genetic screening Unknown if comleted  1 hour GTT 62  3 hour GTT n/a  GBS negative   Rh Immune globulin given: n/a Rubella vaccine given: n/a Tdap vaccine given in AP or PP setting: 10/24/2018 Flu vaccine given in AP or PP setting: did  not get in hospital because records indicated she had antepartum  Plan:  Katrina Hester was discharged to home in good condition. Follow-up appointment at [facility] with delivery provider in 6 weeks  Discharge Instructions: Per After  Visit Summary. Activity: Advance as tolerated. Pelvic rest for 6 weeks.   Diet: Regular Discharge Medications: Allergies as of 01/27/2019   No Known Allergies     Medication List    TAKE these medications   docusate sodium 100 MG capsule Commonly known as:  COLACE Take 1 capsule (100 mg total) by mouth 2 (two) times daily for 14 days. To keep stools soft, as needed   ferrous sulfate 325 (65 FE) MG tablet Take 1 tablet (325 mg total) by mouth daily with breakfast. Take with Vitamin C   ibuprofen 800 MG tablet Commonly known as:  ADVIL,MOTRIN Take 1 tablet (800 mg total) by mouth every 8 (eight) hours as needed for moderate pain or cramping.   prenatal vitamin w/FE, FA 27-1 MG Tabs tablet Take 1 tablet by mouth daily at 12 noon.      Outpatient follow up:  Follow-up Information    [name], CNM. Schedule an appointment as soon as possible for a visit in 6 week(s).   Specialty:  Certified Nurse Midwife Why:  For routine postpartum visit Contact information: [address] [place] [address] [phone]            Signed: Christeen Douglas 01/27/19

## 2019-01-25 NOTE — Plan of Care (Signed)
SVD, stable, transferring to mother baby unit for continued postpartum care

## 2019-01-25 NOTE — Progress Notes (Addendum)
Post Partum Day 1 Subjective: no complaints, up ad lib and voiding  Objective: Blood pressure 118/63, pulse 95, temperature 98 F (36.7 C), temperature source Oral, resp. rate 19, height 5\' 1"  (1.549 m), weight 72.1 kg, last menstrual period 04/13/2018, SpO2 99 %, unknown if currently breastfeeding.  Physical Exam:  General: alert and cooperative Lochia: appropriate Uterine Fundus: firm DVT Evaluation: No evidence of DVT seen on physical exam.  Recent Labs    01/24/19 0053  HGB 10.7*  HCT 32.2*    Assessment/Plan: Plan for discharge tomorrow and Breastfeeding  -Acute blood loss anemia - hemodynamically stable and asymptomatic; start PO ferrous sulfate BID with stool softeners.    LOS: 1 day   Christeen Douglas 01/25/2019, 6:45 PM

## 2019-01-25 NOTE — Discharge Instructions (Signed)

## 2019-01-26 LAB — CBC
HEMATOCRIT: 24.7 % — AB (ref 36.0–46.0)
Hemoglobin: 8.1 g/dL — ABNORMAL LOW (ref 12.0–15.0)
MCH: 30.7 pg (ref 26.0–34.0)
MCHC: 32.8 g/dL (ref 30.0–36.0)
MCV: 93.6 fL (ref 80.0–100.0)
Platelets: 161 10*3/uL (ref 150–400)
RBC: 2.64 MIL/uL — ABNORMAL LOW (ref 3.87–5.11)
RDW: 13.6 % (ref 11.5–15.5)
WBC: 19.6 10*3/uL — ABNORMAL HIGH (ref 4.0–10.5)
nRBC: 0 % (ref 0.0–0.2)

## 2019-01-26 MED ORDER — VITAMIN C 500 MG PO TABS
500.0000 mg | ORAL_TABLET | Freq: Two times a day (BID) | ORAL | Status: DC
  Administered 2019-01-26 – 2019-01-27 (×3): 500 mg via ORAL
  Filled 2019-01-26 (×3): qty 1

## 2019-01-26 NOTE — Progress Notes (Signed)
Post Partum Day 1  Subjective: Doing well, no concerns. Ambulating without difficulty, pain managed with PO meds, tolerating regular diet, and voiding without difficulty.   No fever/chills, chest pain, shortness of breath, nausea/vomiting, or leg pain. No nipple or breast pain.   Objective: BP 105/62 (BP Location: Right Arm)   Pulse 79   Temp 98.6 F (37 C) (Oral)   Resp 20   Ht 5\' 1"  (1.549 m)   Wt 72.1 kg   LMP 04/13/2018 (Approximate)   SpO2 99%   Breastfeeding Unknown   BMI 30.04 kg/m    Physical Exam:  General: alert, cooperative, appears stated age and no distress Breasts: soft/nontender CV: RRR Pulm: nl effort, CTABL Abdomen: soft, non-tender, active bowel sounds Uterine Fundus: firm Lochia: appropriate DVT Evaluation: No evidence of DVT seen on physical exam. No cords or calf tenderness. No significant calf/ankle edema.  Recent Labs    01/24/19 0053 01/26/19 0625  HGB 10.7* 8.1*  HCT 32.2* 24.7*  WBC 9.6 19.6*  PLT 200 161    Assessment/Plan: 22 y.o. G1P1001 postpartum day # 1  -Continue routine postpartum care -Lactation consult PRN for breastfeeding. -Acute blood loss anemia - hemodynamically stable and asymptomatic; continue PO ferrous sulfate BID with stool softeners and vitamin C to aid absorption. Plan for repeat CBC in the morning.  -Mild leukocytosis: no uterine tenderness, plan to repeat CBC in the morning.  -Immunization status: all immunizations up to date  Disposition: Continue inpatient postpartum care. Plan for discharge tomorrow.    LOS: 2 days   [name], CNM 01/26/2019, 8:55 AM   ----- [name] Certified Nurse Midwife [facility]

## 2019-01-26 NOTE — Progress Notes (Signed)
   01/26/19 1300  Clinical Encounter Type  Visited With Patient and family together  Visit Type Follow-up  Referral From Chaplain  Consult/Referral To Chaplain  Chaplain was rounding and follow up on patient since the delivery of her baby. Baby and patient are doing well. Chaplain congratulated and gave blessings.

## 2019-01-26 NOTE — Anesthesia Postprocedure Evaluation (Signed)
Anesthesia Post Note  Patient: Katrina Hester  Procedure(s) Performed: AN AD HOC LABOR EPIDURAL  Patient location during evaluation: Mother Baby Anesthesia Type: Epidural Level of consciousness: awake and alert Pain management: pain level controlled Vital Signs Assessment: post-procedure vital signs reviewed and stable Respiratory status: spontaneous breathing, nonlabored ventilation and respiratory function stable Cardiovascular status: stable Postop Assessment: no headache, no backache and epidural receding Anesthetic complications: no     Last Vitals:  Vitals:   01/25/19 2300 01/26/19 0738  BP: 117/71 105/62  Pulse: (!) 104 79  Resp: 18 20  Temp: 37.1 C 37 C  SpO2: 100% 99%    Last Pain:  Vitals:   01/26/19 0738  TempSrc: Oral  PainSc:                  [name]

## 2019-01-27 LAB — CBC
HCT: 25.3 % — ABNORMAL LOW (ref 36.0–46.0)
Hemoglobin: 8.3 g/dL — ABNORMAL LOW (ref 12.0–15.0)
MCH: 30.5 pg (ref 26.0–34.0)
MCHC: 32.8 g/dL (ref 30.0–36.0)
MCV: 93 fL (ref 80.0–100.0)
Platelets: 219 10*3/uL (ref 150–400)
RBC: 2.72 MIL/uL — ABNORMAL LOW (ref 3.87–5.11)
RDW: 13.3 % (ref 11.5–15.5)
WBC: 17.5 10*3/uL — ABNORMAL HIGH (ref 4.0–10.5)
nRBC: 0.1 % (ref 0.0–0.2)

## 2019-01-27 MED ORDER — DOCUSATE SODIUM 100 MG PO CAPS: 100.0000 mg | ORAL_CAPSULE | Freq: Two times a day (BID) | ORAL | 0 refills | Status: AC

## 2019-01-27 MED ORDER — IBUPROFEN 800 MG PO TABS: 800.0000 mg | ORAL_TABLET | Freq: Three times a day (TID) | ORAL | 1 refills | Status: DC | PRN

## 2019-01-27 MED ORDER — FERROUS SULFATE 325 (65 FE) MG PO TABS: 325.0000 mg | ORAL_TABLET | Freq: Every day | ORAL | 1 refills | Status: DC

## 2019-01-27 NOTE — Lactation Note (Addendum)
This note was copied from a baby's chart. Lactation Consultation Note  Patient Name: Katrina Hester Today's Date: 01/27/2019  Rounded to room, mom asleep and did not awaken when room entered, family member with infant who is also asleep, mom and baby for d/c today   Maternal Data Formula Feeding for Exclusion: No Has patient been taught Hand Expression?: Yes Does the patient have breastfeeding experience prior to this delivery?: No  Feeding    LATCH Score                   Interventions    Lactation Tools Discussed/Used     Consult Status      Katrina Hester 01/27/2019, 12:12 PM

## 2019-01-27 NOTE — Progress Notes (Signed)
Discharge order received from doctor. Reviewed discharge instructions and prescriptions with patient and answered all questions. Follow up appointment instructions given. Patient verbalized understanding. ID bands checked. Patient discharged home with infant via wheelchair by nursing/auxillary.    Richad Ramsay Garner, RN  

## 2020-07-25 DIAGNOSIS — Z3481 Encounter for supervision of other normal pregnancy, first trimester: Secondary | ICD-10-CM | POA: Diagnosis not present

## 2020-07-25 DIAGNOSIS — Z1389 Encounter for screening for other disorder: Secondary | ICD-10-CM | POA: Diagnosis not present

## 2020-07-25 DIAGNOSIS — Z3200 Encounter for pregnancy test, result unknown: Secondary | ICD-10-CM | POA: Diagnosis not present

## 2020-07-25 DIAGNOSIS — Z87448 Personal history of other diseases of urinary system: Secondary | ICD-10-CM | POA: Diagnosis not present

## 2020-07-25 DIAGNOSIS — Z7189 Other specified counseling: Secondary | ICD-10-CM | POA: Diagnosis not present

## 2020-07-31 ENCOUNTER — Other Ambulatory Visit: Payer: Self-pay | Admitting: Family Medicine

## 2020-07-31 DIAGNOSIS — Z3481 Encounter for supervision of other normal pregnancy, first trimester: Secondary | ICD-10-CM

## 2020-08-07 ENCOUNTER — Ambulatory Visit
Admission: RE | Admit: 2020-08-07 | Discharge: 2020-08-07 | Disposition: A | Discharge status: Home / Self Care | Payer: Medicaid Other | Source: Ambulatory Visit | Attending: Family Medicine | Admitting: Family Medicine

## 2020-08-07 ENCOUNTER — Other Ambulatory Visit: Payer: Self-pay

## 2020-08-07 DIAGNOSIS — Z3491 Encounter for supervision of normal pregnancy, unspecified, first trimester: Secondary | ICD-10-CM | POA: Diagnosis not present

## 2020-08-07 DIAGNOSIS — Z3481 Encounter for supervision of other normal pregnancy, first trimester: Secondary | ICD-10-CM | POA: Diagnosis not present

## 2020-08-07 DIAGNOSIS — Z3A08 8 weeks gestation of pregnancy: Secondary | ICD-10-CM | POA: Diagnosis not present

## 2020-08-22 DIAGNOSIS — Z7189 Other specified counseling: Secondary | ICD-10-CM | POA: Diagnosis not present

## 2020-08-22 DIAGNOSIS — Z1389 Encounter for screening for other disorder: Secondary | ICD-10-CM | POA: Diagnosis not present

## 2020-08-22 DIAGNOSIS — Z3481 Encounter for supervision of other normal pregnancy, first trimester: Secondary | ICD-10-CM | POA: Diagnosis not present

## 2020-08-22 LAB — OB RESULTS CONSOLE ANTIBODY SCREEN: Antibody Screen: NEGATIVE

## 2020-08-22 LAB — OB RESULTS CONSOLE ABO/RH: RH Type: POSITIVE

## 2020-08-22 LAB — OB RESULTS CONSOLE HGB/HCT, BLOOD
HCT: 37 (ref 29–41)
Hemoglobin: 12.3

## 2020-08-22 LAB — OB RESULTS CONSOLE HIV ANTIBODY (ROUTINE TESTING): HIV: NONREACTIVE

## 2020-08-22 LAB — OB RESULTS CONSOLE PLATELET COUNT: Platelets: 323

## 2020-08-22 LAB — OB RESULTS CONSOLE HEPATITIS B SURFACE ANTIGEN: Hepatitis B Surface Ag: NEGATIVE

## 2020-08-22 LAB — OB RESULTS CONSOLE GC/CHLAMYDIA
Chlamydia: NEGATIVE
Gonorrhea: NEGATIVE

## 2020-08-22 LAB — OB RESULTS CONSOLE RPR: RPR: NONREACTIVE

## 2020-08-22 LAB — OB RESULTS CONSOLE RUBELLA ANTIBODY, IGM: Rubella: IMMUNE

## 2020-08-22 LAB — OB RESULTS CONSOLE VARICELLA ZOSTER ANTIBODY, IGG: Varicella: IMMUNE

## 2020-08-22 LAB — SICKLE CELL SCREEN: Sickle Cell Screen: NEGATIVE

## 2020-09-18 DIAGNOSIS — Z1389 Encounter for screening for other disorder: Secondary | ICD-10-CM | POA: Diagnosis not present

## 2020-09-18 DIAGNOSIS — Z3481 Encounter for supervision of other normal pregnancy, first trimester: Secondary | ICD-10-CM | POA: Diagnosis not present

## 2020-09-18 DIAGNOSIS — Z23 Encounter for immunization: Secondary | ICD-10-CM | POA: Diagnosis not present

## 2020-09-23 ENCOUNTER — Other Ambulatory Visit: Payer: Self-pay | Admitting: Physician Assistant

## 2020-09-23 DIAGNOSIS — Z3481 Encounter for supervision of other normal pregnancy, first trimester: Secondary | ICD-10-CM

## 2020-10-16 ENCOUNTER — Encounter: Payer: Self-pay | Admitting: General Practice

## 2020-10-16 DIAGNOSIS — Z1389 Encounter for screening for other disorder: Secondary | ICD-10-CM | POA: Diagnosis not present

## 2020-10-16 DIAGNOSIS — Z3481 Encounter for supervision of other normal pregnancy, first trimester: Secondary | ICD-10-CM | POA: Diagnosis not present

## 2020-10-18 ENCOUNTER — Ambulatory Visit
Admission: RE | Admit: 2020-10-18 | Discharge: 2020-10-18 | Disposition: A | Discharge status: Home / Self Care | Payer: Medicaid Other | Source: Ambulatory Visit | Attending: Physician Assistant | Admitting: Physician Assistant

## 2020-10-18 ENCOUNTER — Other Ambulatory Visit: Payer: Self-pay

## 2020-10-18 DIAGNOSIS — Z3481 Encounter for supervision of other normal pregnancy, first trimester: Secondary | ICD-10-CM

## 2020-10-18 DIAGNOSIS — Z3492 Encounter for supervision of normal pregnancy, unspecified, second trimester: Secondary | ICD-10-CM | POA: Diagnosis not present

## 2020-10-22 ENCOUNTER — Other Ambulatory Visit (HOSPITAL_COMMUNITY)
Admission: RE | Admit: 2020-10-22 | Discharge: 2020-10-22 | Disposition: A | Discharge status: Home / Self Care | Payer: Medicaid Other | Source: Ambulatory Visit | Attending: Obstetrics and Gynecology | Admitting: Obstetrics and Gynecology

## 2020-10-22 ENCOUNTER — Ambulatory Visit (INDEPENDENT_AMBULATORY_CARE_PROVIDER_SITE_OTHER): Payer: Medicaid Other | Admitting: Obstetrics and Gynecology

## 2020-10-22 ENCOUNTER — Other Ambulatory Visit: Payer: Self-pay

## 2020-10-22 VITALS — BP 113/73 | HR 87 | Wt 147.0 lb

## 2020-10-22 DIAGNOSIS — Z719 Counseling, unspecified: Secondary | ICD-10-CM | POA: Diagnosis not present

## 2020-10-22 DIAGNOSIS — Z3480 Encounter for supervision of other normal pregnancy, unspecified trimester: Secondary | ICD-10-CM

## 2020-10-22 DIAGNOSIS — Z3400 Encounter for supervision of normal first pregnancy, unspecified trimester: Secondary | ICD-10-CM | POA: Diagnosis not present

## 2020-10-22 DIAGNOSIS — Z3A19 19 weeks gestation of pregnancy: Secondary | ICD-10-CM

## 2020-10-22 MED ORDER — BLOOD PRESSURE KIT DEVI: 1.0000 | Freq: Every day | 0 refills | Status: AC

## 2020-10-22 NOTE — Progress Notes (Signed)
Subjective:    Katrina Hester is a G2P1001 [redacted]w[redacted]d being seen today for her first obstetrical visit.  Her obstetrical history is uncomplicated. Patient does intend to breast feed. Pregnancy history fully reviewed.  Patient reports no complaints. Lives in [place] now. Just had her initial prenatal visit and prenatal labs done at [facility]. Has had a recent pap. Reviewed labs and they are all normal. History of anemia in prior pregnancy, current hgb 12.3.     Vitals:   10/22/20 1008  BP: 113/73  Pulse: 87  Weight: 147 lb (66.7 kg)    HISTORY: OB History  Gravida Para Term Preterm AB Living  2 1 1  0 0 1  SAB TAB Ectopic Multiple Live Births  0 0 0 0 1    # Outcome Date GA Lbr Len/2nd Weight Sex Delivery Anes PTL Lv  2 Current           1 Term 01/25/19 [redacted]w[redacted]d 24:58 / 03:05 8 lb 13.8 oz (4.02 kg) F Vag-Spont EPI  LIV   Past Medical History:  Diagnosis Date  . Medical history non-contributory    Past Surgical History:  Procedure Laterality Date  . NO PAST SURGERIES     No family history on file.   Exam    Uterus:     Pelvic Exam:  deferred, patient had initial OB at prior clinic                               System: Breast:  normal appearance, no masses or tenderness   Skin: normal coloration and turgor, no rashes    Neurologic: oriented, normal   Extremities: normal strength, tone, and muscle mass   HEENT PERRLA   Mouth/Teeth mucous membranes moist, pharynx normal without lesions   Neck supple   Cardiovascular: regular rate and rhythm   Respiratory:  appears well, vitals normal, no respiratory distress, acyanotic, normal RR, ear and throat exam is normal, chest clear, no wheezing, crepitations, rhonchi, normal symmetric air entry   Abdomen: soft, non-tender; bowel sounds normal; no masses,  no organomegaly   Urinary: deferred      Assessment:    Pregnancy: G2P1001 Patient Active Problem List   Diagnosis Date Noted  .  Supervision of other normal pregnancy, antepartum 10/22/2020  . Supervision of normal pregnancy in third trimester 01/24/2019  . Pregnancy 01/23/2019        Plan:     Initial labs drawn. Prenatal vitamins. Problem list reviewed and updated. Genetic Screening discussed and deferred given gestational age, normal anatomy scan.   Ultrasound completed.  Follow up in 4 weeks.   Patient received flu shot on 11/6.   COVID-19 Vaccine Counseling: The patient was counseled on the potential benefits and lack of known risks of COVID vaccination, during pregnancy and breastfeeding, during today's visit. The patient's questions and concerns were addressed today, including safety of the vaccination and potential side effects as they have been published by ACOG and SMFM. The patient has been informed that there have not been any documented vaccine related injuries, deaths or birth defects to infant or mom after receiving the COVID-19 vaccine to date. The patient has been made aware that although she is not at increased risk of contracting COVID-19 during pregnancy, she is at increased risk of developing severe disease and complications if she contracts COVID-19 while pregnant. All patient questions were addressed during our visit today. The patient is  not planning to get vaccinated at this time.     Gita Kudo 10/22/2020

## 2020-10-23 LAB — GC/CHLAMYDIA PROBE AMP (~~LOC~~) NOT AT ARMC
Chlamydia: NEGATIVE
Comment: NEGATIVE
Comment: NORMAL
Neisseria Gonorrhea: NEGATIVE

## 2020-10-24 LAB — URINE CULTURE, OB REFLEX

## 2020-10-24 LAB — CULTURE, OB URINE

## 2020-11-22 ENCOUNTER — Other Ambulatory Visit: Payer: Self-pay

## 2020-11-22 ENCOUNTER — Encounter: Payer: Self-pay | Admitting: Medical

## 2020-11-22 ENCOUNTER — Ambulatory Visit (INDEPENDENT_AMBULATORY_CARE_PROVIDER_SITE_OTHER): Payer: Medicaid Other | Admitting: Medical

## 2020-11-22 VITALS — BP 117/62 | HR 96 | Wt 145.3 lb

## 2020-11-22 DIAGNOSIS — Z3A23 23 weeks gestation of pregnancy: Secondary | ICD-10-CM

## 2020-11-22 DIAGNOSIS — Z3480 Encounter for supervision of other normal pregnancy, unspecified trimester: Secondary | ICD-10-CM

## 2020-11-22 NOTE — Patient Instructions (Addendum)

## 2020-11-22 NOTE — Progress Notes (Signed)
   PRENATAL VISIT NOTE  Subjective:  Katrina Hester is a 24 y.o. G2P1001 at [redacted]w[redacted]d being seen today for ongoing prenatal care.  She is currently monitored for the following issues for this low-risk pregnancy and has Supervision of other normal pregnancy, antepartum on their problem list.  Patient reports no complaints.  Contractions: Not present. Vag. Bleeding: None.  Movement: Present. Denies leaking of fluid.   The following portions of the patient's history were reviewed and updated as appropriate: allergies, current medications, past family history, past medical history, past social history, past surgical history and problem list.   Objective:   Vitals:   11/22/20 0823  BP: 117/62  Pulse: 96  Weight: 145 lb 4.8 oz (65.9 kg)    Fetal Status: Fetal Heart Rate (bpm): 142 Fundal Height: 23 cm Movement: Present     General:  Alert, oriented and cooperative. Patient is in no acute distress.  Skin: Skin is warm and dry. No rash noted.   Cardiovascular: Normal heart rate noted  Respiratory: Normal respiratory effort, no problems with respiration noted  Abdomen: Soft, gravid, appropriate for gestational age.  Pain/Pressure: Absent     Pelvic: Cervical exam deferred        Extremities: Normal range of motion.  Edema: None  Mental Status: Normal mood and affect. Normal behavior. Normal judgment and thought content.   Assessment and Plan:  Pregnancy: G2P1001 at [redacted]w[redacted]d 1. Supervision of other normal pregnancy, antepartum - Doing well - No complaints - Anticipatory guidance for next visit discussed including CBC, Hep C, HIV, RPR and TDap - Pap completed in [place] with last pregnancy in 2019, ROI requested today.   2. [redacted] weeks gestation of pregnancy  Preterm labor symptoms and general obstetric precautions including but not limited to vaginal bleeding, contractions, leaking of fluid and fetal movement were reviewed in detail with the patient. Please refer to After Visit Summary for  other counseling recommendations.   Return in about 4 weeks (around 12/20/2020) for LOB, 28 week labs (fasting), In-Person, any provider.  No future appointments.  Vonzella Nipple, PA-C

## 2020-12-14 NOTE — L&D Delivery Note (Addendum)
OB/GYN Faculty Practice Delivery Note  Katrina Hester is a 25 y.o. G2P1001 s/p vaginal delivery at [redacted]w[redacted]d. She was admitted for IOL for a1GDM.   ROM: 2h 62m with meconium stained fluid GBS Status:  Negative/-- (03/07 0938) Maximum Maternal Temperature:  Temp (24hrs), Avg:98.3 F (36.8 C), Min:98 F (36.7 C), Max:98.6 F (37 C)  Labor Progress: Patient had cervical dilation to 3cm on arrival. She received cytotec and FB was placed. She was then started on pitocin and had SROM for meconium stained fluid. She then progressed to complete cervical dilation and had an uncomplicated delivery as noted below.   Delivery Date/Time: 03/10/2021 at 0523 Delivery: Called to room and patient was complete and pushing. Head delivered in ROA position. No nuchal cord present. Shoulder and body delivered in usual fashion. Infant with spontaneous cry, placed on mother's abdomen, dried and stimulated. Cord clamped x 2 after 1-minute delay, and cut by FOB. Cord blood drawn. Placenta delivered spontaneously with gentle cord traction. Fundus firm with massage and Pitocin. Labia, perineum, vagina, and cervix inspected with first degree laceration to the perineum repaired with 3-0 Vicryl suture.   Placenta: spontaneous, intact, 3 vessel cord, sent to L&D Complications: none Lacerations: 1st degree perineal laceration s/p repair EBL: 50 ml  Analgesia: epidural    Infant:  Viable female APGAR (1 MIN): 8   APGAR (5 MINS): 9    Weight: per medical record  Derrel Nip, MD  PGY-2, [facility]  03/10/2021 5:43 AM  I was present and gloved for delivery of infant and placenta. I agree with resident's documentation as noted above.  Sheila Oats, MD OB Fellow, Faculty Practice 03/10/2021 5:55 AM

## 2020-12-17 ENCOUNTER — Other Ambulatory Visit: Payer: Self-pay

## 2020-12-17 DIAGNOSIS — Z3480 Encounter for supervision of other normal pregnancy, unspecified trimester: Secondary | ICD-10-CM

## 2020-12-20 ENCOUNTER — Ambulatory Visit (INDEPENDENT_AMBULATORY_CARE_PROVIDER_SITE_OTHER): Payer: Medicaid Other | Admitting: Family Medicine

## 2020-12-20 ENCOUNTER — Encounter: Payer: Self-pay | Admitting: Family Medicine

## 2020-12-20 ENCOUNTER — Other Ambulatory Visit: Payer: Self-pay

## 2020-12-20 ENCOUNTER — Other Ambulatory Visit: Payer: Medicaid Other

## 2020-12-20 VITALS — BP 118/65 | HR 95 | Wt 152.8 lb

## 2020-12-20 DIAGNOSIS — Z23 Encounter for immunization: Secondary | ICD-10-CM

## 2020-12-20 DIAGNOSIS — Z3480 Encounter for supervision of other normal pregnancy, unspecified trimester: Secondary | ICD-10-CM

## 2020-12-20 NOTE — Progress Notes (Signed)
   Subjective:  Katrina Hester is a 25 y.o. G2P1001 at [redacted]w[redacted]d being seen today for ongoing prenatal care.  She is currently monitored for the following issues for this low-risk pregnancy and has Supervision of other normal pregnancy, antepartum on their problem list.  Patient reports no complaints.  Contractions: Not present. Vag. Bleeding: None.  Movement: Present. Denies leaking of fluid.   The following portions of the patient's history were reviewed and updated as appropriate: allergies, current medications, past family history, past medical history, past social history, past surgical history and problem list. Problem list updated.  Objective:   Vitals:   12/20/20 0850  BP: 118/65  Pulse: 95  Weight: 152 lb 12.8 oz (69.3 kg)    Fetal Status:     Movement: Present     General:  Alert, oriented and cooperative. Patient is in no acute distress.  Skin: Skin is warm and dry. No rash noted.   Cardiovascular: Normal heart rate noted  Respiratory: Normal respiratory effort, no problems with respiration noted  Abdomen: Soft, gravid, appropriate for gestational age. Pain/Pressure: Absent     Pelvic: Vag. Bleeding: None     Cervical exam deferred        Extremities: Normal range of motion.  Edema: None  Mental Status: Normal mood and affect. Normal behavior. Normal judgment and thought content.   Urinalysis:      Assessment and Plan:  Pregnancy: G2P1001 at [redacted]w[redacted]d  1. Supervision of other normal pregnancy, antepartum BP and FHR normal 28wk labs and TDaP today  Preterm labor symptoms and general obstetric precautions including but not limited to vaginal bleeding, contractions, leaking of fluid and fetal movement were reviewed in detail with the patient. Please refer to After Visit Summary for other counseling recommendations.  Return in 2 weeks (on 01/03/2021) for [facility], ob visit.   Venora Maples, MD

## 2020-12-20 NOTE — Patient Instructions (Signed)
 Contraception Choices Contraception, also called birth control, refers to methods or devices that prevent pregnancy. Hormonal methods Contraceptive implant  A contraceptive implant is a thin, plastic tube that contains a hormone. It is inserted into the upper part of the arm. It can remain in place for up to 3 years. Progestin-only injections Progestin-only injections are injections of progestin, a synthetic form of the hormone progesterone. They are given every 3 months by a health care provider. Birth control pills  Birth control pills are pills that contain hormones that prevent pregnancy. They must be taken once a day, preferably at the same time each day. Birth control patch  The birth control patch contains hormones that prevent pregnancy. It is placed on the skin and must be changed once a week for three weeks and removed on the fourth week. A prescription is needed to use this method of contraception. Vaginal ring  A vaginal ring contains hormones that prevent pregnancy. It is placed in the vagina for three weeks and removed on the fourth week. After that, the process is repeated with a new ring. A prescription is needed to use this method of contraception. Emergency contraceptive Emergency contraceptives prevent pregnancy after unprotected sex. They come in pill form and can be taken up to 5 days after sex. They work best the sooner they are taken after having sex. Most emergency contraceptives are available without a prescription. This method should not be used as your only form of birth control. Barrier methods Female condom  A female condom is a thin sheath that is worn over the penis during sex. Condoms keep sperm from going inside a woman's body. They can be used with a spermicide to increase their effectiveness. They should be disposed after a single use. Female condom  A female condom is a soft, loose-fitting sheath that is put into the vagina before sex. The condom keeps  sperm from going inside a woman's body. They should be disposed after a single use. Diaphragm  A diaphragm is a soft, dome-shaped barrier. It is inserted into the vagina before sex, along with a spermicide. The diaphragm blocks sperm from entering the uterus, and the spermicide kills sperm. A diaphragm should be left in the vagina for 6-8 hours after sex and removed within 24 hours. A diaphragm is prescribed and fitted by a health care provider. A diaphragm should be replaced every 1-2 years, after giving birth, after gaining more than 15 lb (6.8 kg), and after pelvic surgery. Cervical cap  A cervical cap is a round, soft latex or plastic cup that fits over the cervix. It is inserted into the vagina before sex, along with spermicide. It blocks sperm from entering the uterus. The cap should be left in place for 6-8 hours after sex and removed within 48 hours. A cervical cap must be prescribed and fitted by a health care provider. It should be replaced every 2 years. Sponge  A sponge is a soft, circular piece of polyurethane foam with spermicide on it. The sponge helps block sperm from entering the uterus, and the spermicide kills sperm. To use it, you make it wet and then insert it into the vagina. It should be inserted before sex, left in for at least 6 hours after sex, and removed and thrown away within 30 hours. Spermicides Spermicides are chemicals that kill or block sperm from entering the cervix and uterus. They can come as a cream, jelly, suppository, foam, or tablet. A spermicide should be inserted into   the vagina with an applicator at least 10-15 minutes before sex to allow time for it to work. The process must be repeated every time you have sex. Spermicides do not require a prescription. Intrauterine contraception Intrauterine device (IUD) An IUD is a T-shaped device that is put in a woman's uterus. There are two types:  Hormone IUD.This type contains progestin, a synthetic form of the  hormone progesterone. This type can stay in place for 3-5 years.  Copper IUD.This type is wrapped in copper wire. It can stay in place for 10 years.  Permanent methods of contraception Female tubal ligation In this method, a woman's fallopian tubes are sealed, tied, or blocked during surgery to prevent eggs from traveling to the uterus. Hysteroscopic sterilization In this method, a small, flexible insert is placed into each fallopian tube. The inserts cause scar tissue to form in the fallopian tubes and block them, so sperm cannot reach an egg. The procedure takes about 3 months to be effective. Another form of birth control must be used during those 3 months. Female sterilization This is a procedure to tie off the tubes that carry sperm (vasectomy). After the procedure, the man can still ejaculate fluid (semen). Natural planning methods Natural family planning In this method, a couple does not have sex on days when the woman could become pregnant. Calendar method This means keeping track of the length of each menstrual cycle, identifying the days when pregnancy can happen, and not having sex on those days. Ovulation method In this method, a couple avoids sex during ovulation. Symptothermal method This method involves not having sex during ovulation. The woman typically checks for ovulation by watching changes in her temperature and in the consistency of cervical mucus. Post-ovulation method In this method, a couple waits to have sex until after ovulation. Summary  Contraception, also called birth control, means methods or devices that prevent pregnancy.  Hormonal methods of contraception include implants, injections, pills, patches, vaginal rings, and emergency contraceptives.  Barrier methods of contraception can include female condoms, female condoms, diaphragms, cervical caps, sponges, and spermicides.  There are two types of IUDs (intrauterine devices). An IUD can be put in a woman's  uterus to prevent pregnancy for 3-5 years.  Permanent sterilization can be done through a procedure for males, females, or both.  Natural family planning methods involve not having sex on days when the woman could become pregnant. This information is not intended to replace advice given to you by your health care provider. Make sure you discuss any questions you have with your health care provider. Document Revised: 12/02/2017 Document Reviewed: 01/02/2017 Elsevier Patient Education  2020 [employer]   Breastfeeding  Choosing to breastfeed is one of the best decisions you can make for yourself and your baby. A change in hormones during pregnancy causes your breasts to make breast milk in your milk-producing glands. Hormones prevent breast milk from being released before your baby is born. They also prompt milk flow after birth. Once breastfeeding has begun, thoughts of your baby, as well as his or her sucking or crying, can stimulate the release of milk from your milk-producing glands. Benefits of breastfeeding Research shows that breastfeeding offers many health benefits for infants and mothers. It also offers a cost-free and convenient way to feed your baby. For your baby  Your first milk (colostrum) helps your baby's digestive system to function better.  Special cells in your milk (antibodies) help your baby to fight off infections.  Breastfed babies are   less likely to develop asthma, allergies, obesity, or type 2 diabetes. They are also at lower risk for sudden infant death syndrome (SIDS).  Nutrients in breast milk are better able to meet your baby's needs compared to infant formula.  Breast milk improves your baby's brain development. For you  Breastfeeding helps to create a very special bond between you and your baby.  Breastfeeding is convenient. Breast milk costs nothing and is always available at the correct temperature.  Breastfeeding helps to burn calories. It helps you  to lose the weight that you gained during pregnancy.  Breastfeeding makes your uterus return faster to its size before pregnancy. It also slows bleeding (lochia) after you give birth.  Breastfeeding helps to lower your risk of developing type 2 diabetes, osteoporosis, rheumatoid arthritis, cardiovascular disease, and breast, ovarian, uterine, and endometrial cancer later in life. Breastfeeding basics Starting breastfeeding  Find a comfortable place to sit or lie down, with your neck and back well-supported.  Place a pillow or a rolled-up blanket under your baby to bring him or her to the level of your breast (if you are seated). Nursing pillows are specially designed to help support your arms and your baby while you breastfeed.  Make sure that your baby's tummy (abdomen) is facing your abdomen.  Gently massage your breast. With your fingertips, massage from the outer edges of your breast inward toward the nipple. This encourages milk flow. If your milk flows slowly, you may need to continue this action during the feeding.  Support your breast with 4 fingers underneath and your thumb above your nipple (make the letter "C" with your hand). Make sure your fingers are well away from your nipple and your baby's mouth.  Stroke your baby's lips gently with your finger or nipple.  When your baby's mouth is open wide enough, quickly bring your baby to your breast, placing your entire nipple and as much of the areola as possible into your baby's mouth. The areola is the colored area around your nipple. ? More areola should be visible above your baby's upper lip than below the lower lip. ? Your baby's lips should be opened and extended outward (flanged) to ensure an adequate, comfortable latch. ? Your baby's tongue should be between his or her lower gum and your breast.  Make sure that your baby's mouth is correctly positioned around your nipple (latched). Your baby's lips should create a seal on your  breast and be turned out (everted).  It is common for your baby to suck about 2-3 minutes in order to start the flow of breast milk. Latching Teaching your baby how to latch onto your breast properly is very important. An improper latch can cause nipple pain, decreased milk supply, and poor weight gain in your baby. Also, if your baby is not latched onto your nipple properly, he or she may swallow some air during feeding. This can make your baby fussy. Burping your baby when you switch breasts during the feeding can help to get rid of the air. However, teaching your baby to latch on properly is still the best way to prevent fussiness from swallowing air while breastfeeding. Signs that your baby has successfully latched onto your nipple  Silent tugging or silent sucking, without causing you pain. Infant's lips should be extended outward (flanged).  Swallowing heard between every 3-4 sucks once your milk has started to flow (after your let-down milk reflex occurs).  Muscle movement above and in front of his or her   ears while sucking. Signs that your baby has not successfully latched onto your nipple  Sucking sounds or smacking sounds from your baby while breastfeeding.  Nipple pain. If you think your baby has not latched on correctly, slip your finger into the corner of your baby's mouth to break the suction and place it between your baby's gums. Attempt to start breastfeeding again. Signs of successful breastfeeding Signs from your baby  Your baby will gradually decrease the number of sucks or will completely stop sucking.  Your baby will fall asleep.  Your baby's body will relax.  Your baby will retain a small amount of milk in his or her mouth.  Your baby will let go of your breast by himself or herself. Signs from you  Breasts that have increased in firmness, weight, and size 1-3 hours after feeding.  Breasts that are softer immediately after breastfeeding.  Increased milk  volume, as well as a change in milk consistency and color by the fifth day of breastfeeding.  Nipples that are not sore, cracked, or bleeding. Signs that your baby is getting enough milk  Wetting at least 1-2 diapers during the first 24 hours after birth.  Wetting at least 5-6 diapers every 24 hours for the first week after birth. The urine should be clear or pale yellow by the age of 5 days.  Wetting 6-8 diapers every 24 hours as your baby continues to grow and develop.  At least 3 stools in a 24-hour period by the age of 5 days. The stool should be soft and yellow.  At least 3 stools in a 24-hour period by the age of 7 days. The stool should be seedy and yellow.  No loss of weight greater than 10% of birth weight during the first 3 days of life.  Average weight gain of 4-7 oz (113-198 g) per week after the age of 4 days.  Consistent daily weight gain by the age of 5 days, without weight loss after the age of 2 weeks. After a feeding, your baby may spit up a small amount of milk. This is normal. Breastfeeding frequency and duration Frequent feeding will help you make more milk and can prevent sore nipples and extremely full breasts (breast engorgement). Breastfeed when you feel the need to reduce the fullness of your breasts or when your baby shows signs of hunger. This is called "breastfeeding on demand." Signs that your baby is hungry include:  Increased alertness, activity, or restlessness.  Movement of the head from side to side.  Opening of the mouth when the corner of the mouth or cheek is stroked (rooting).  Increased sucking sounds, smacking lips, cooing, sighing, or squeaking.  Hand-to-mouth movements and sucking on fingers or hands.  Fussing or crying. Avoid introducing a pacifier to your baby in the first 4-6 weeks after your baby is born. After this time, you may choose to use a pacifier. Research has shown that pacifier use during the first year of a baby's life  decreases the risk of sudden infant death syndrome (SIDS). Allow your baby to feed on each breast as long as he or she wants. When your baby unlatches or falls asleep while feeding from the first breast, offer the second breast. Because newborns are often sleepy in the first few weeks of life, you may need to awaken your baby to get him or her to feed. Breastfeeding times will vary from baby to baby. However, the following rules can serve as a guide to   help you make sure that your baby is properly fed:  Newborns (babies 4 weeks of age or younger) may breastfeed every 1-3 hours.  Newborns should not go without breastfeeding for longer than 3 hours during the day or 5 hours during the night.  You should breastfeed your baby a minimum of 8 times in a 24-hour period. Breast milk pumping     Pumping and storing breast milk allows you to make sure that your baby is exclusively fed your breast milk, even at times when you are unable to breastfeed. This is especially important if you go back to work while you are still breastfeeding, or if you are not able to be present during feedings. Your lactation consultant can help you find a method of pumping that works best for you and give you guidelines about how long it is safe to store breast milk. Caring for your breasts while you breastfeed Nipples can become dry, cracked, and sore while breastfeeding. The following recommendations can help keep your breasts moisturized and healthy:  Avoid using soap on your nipples.  Wear a supportive bra designed especially for nursing. Avoid wearing underwire-style bras or extremely tight bras (sports bras).  Air-dry your nipples for 3-4 minutes after each feeding.  Use only cotton bra pads to absorb leaked breast milk. Leaking of breast milk between feedings is normal.  Use lanolin on your nipples after breastfeeding. Lanolin helps to maintain your skin's normal moisture barrier. Pure lanolin is not harmful (not  toxic) to your baby. You may also hand express a few drops of breast milk and gently massage that milk into your nipples and allow the milk to air-dry. In the first few weeks after giving birth, some women experience breast engorgement. Engorgement can make your breasts feel heavy, warm, and tender to the touch. Engorgement peaks within 3-5 days after you give birth. The following recommendations can help to ease engorgement:  Completely empty your breasts while breastfeeding or pumping. You may want to start by applying warm, moist heat (in the shower or with warm, water-soaked hand towels) just before feeding or pumping. This increases circulation and helps the milk flow. If your baby does not completely empty your breasts while breastfeeding, pump any extra milk after he or she is finished.  Apply ice packs to your breasts immediately after breastfeeding or pumping, unless this is too uncomfortable for you. To do this: ? Put ice in a plastic bag. ? Place a towel between your skin and the bag. ? Leave the ice on for 20 minutes, 2-3 times a day.  Make sure that your baby is latched on and positioned properly while breastfeeding. If engorgement persists after 48 hours of following these recommendations, contact your health care provider or a lactation consultant. Overall health care recommendations while breastfeeding  Eat 3 healthy meals and 3 snacks every day. Well-nourished mothers who are breastfeeding need an additional 450-500 calories a day. You can meet this requirement by increasing the amount of a balanced diet that you eat.  Drink enough water to keep your urine pale yellow or clear.  Rest often, relax, and continue to take your prenatal vitamins to prevent fatigue, stress, and low vitamin and mineral levels in your body (nutrient deficiencies).  Do not use any products that contain nicotine or tobacco, such as cigarettes and e-cigarettes. Your baby may be harmed by chemicals from  cigarettes that pass into breast milk and exposure to secondhand smoke. If you need help quitting, ask your   health care provider.  Avoid alcohol.  Do not use illegal drugs or marijuana.  Talk with your health care provider before taking any medicines. These include over-the-counter and prescription medicines as well as vitamins and herbal supplements. Some medicines that may be harmful to your baby can pass through breast milk.  It is possible to become pregnant while breastfeeding. If birth control is desired, ask your health care provider about options that will be safe while breastfeeding your baby. Where to find more information: [employer]: www.llli.org Contact a health care provider if:  You feel like you want to stop breastfeeding or have become frustrated with breastfeeding.  Your nipples are cracked or bleeding.  Your breasts are red, tender, or warm.  You have: ? Painful breasts or nipples. ? A swollen area on either breast. ? A fever or chills. ? Nausea or vomiting. ? Drainage other than breast milk from your nipples.  Your breasts do not become full before feedings by the fifth day after you give birth.  You feel sad and depressed.  Your baby is: ? Too sleepy to eat well. ? Having trouble sleeping. ? More than 1 week old and wetting fewer than 6 diapers in a 24-hour period. ? Not gaining weight by 5 days of age.  Your baby has fewer than 3 stools in a 24-hour period.  Your baby's skin or the white parts of his or her eyes become yellow. Get help right away if:  Your baby is overly tired (lethargic) and does not want to wake up and feed.  Your baby develops an unexplained fever. Summary  Breastfeeding offers many health benefits for infant and mothers.  Try to breastfeed your infant when he or she shows early signs of hunger.  Gently tickle or stroke your baby's lips with your finger or nipple to allow the baby to open his or her mouth.  Bring the baby to your breast. Make sure that much of the areola is in your baby's mouth. Offer one side and burp the baby before you offer the other side.  Talk with your health care provider or lactation consultant if you have questions or you face problems as you breastfeed. This information is not intended to replace advice given to you by your health care provider. Make sure you discuss any questions you have with your health care provider. Document Revised: 02/24/2018 Document Reviewed: 01/01/2017 Elsevier Patient Education  2020 [employer] Inc.  

## 2020-12-20 NOTE — Addendum Note (Signed)
Addended by: Henrietta Dine on: 12/20/2020 11:37 AM   Modules accepted: Orders

## 2020-12-21 LAB — RPR: RPR Ser Ql: NONREACTIVE

## 2020-12-21 LAB — CBC
Hematocrit: 29.2 % — ABNORMAL LOW (ref 34.0–46.6)
Hemoglobin: 9.9 g/dL — ABNORMAL LOW (ref 11.1–15.9)
MCH: 30.2 pg (ref 26.6–33.0)
MCHC: 33.9 g/dL (ref 31.5–35.7)
MCV: 89 fL (ref 79–97)
Platelets: 300 10*3/uL (ref 150–450)
RBC: 3.28 x10E6/uL — ABNORMAL LOW (ref 3.77–5.28)
RDW: 11.7 % (ref 11.7–15.4)
WBC: 9.3 10*3/uL (ref 3.4–10.8)

## 2020-12-21 LAB — HIV ANTIBODY (ROUTINE TESTING W REFLEX): HIV Screen 4th Generation wRfx: NONREACTIVE

## 2020-12-21 LAB — HEPATITIS C ANTIBODY: Hep C Virus Ab: <0.1 s/co ratio (ref 0.0–0.9)

## 2020-12-21 LAB — GLUCOSE TOLERANCE, 2 HOURS W/ 1HR
Glucose, 1 hour: 147 mg/dL (ref 65–179)
Glucose, 2 hour: 126 mg/dL (ref 65–152)
Glucose, Fasting: 98 mg/dL — ABNORMAL HIGH (ref 65–91)

## 2020-12-23 ENCOUNTER — Telehealth: Payer: Self-pay | Admitting: Lactation Services

## 2020-12-23 ENCOUNTER — Telehealth: Payer: Self-pay | Admitting: Family Medicine

## 2020-12-23 DIAGNOSIS — Z3480 Encounter for supervision of other normal pregnancy, unspecified trimester: Secondary | ICD-10-CM

## 2020-12-23 DIAGNOSIS — O24410 Gestational diabetes mellitus in pregnancy, diet controlled: Secondary | ICD-10-CM

## 2020-12-23 MED ORDER — FERROUS SULFATE 325 (65 FE) MG PO TABS: 325.0000 mg | ORAL_TABLET | Freq: Every day | ORAL | 1 refills | Status: DC

## 2020-12-23 NOTE — Telephone Encounter (Signed)
Called patient to inform her of GDM status and need for Fe supplementation . She did not answer. LM for her to call the office for results.   Message to front desk to scheduled for Diabetes Education.   Will send My Chart message to patient.

## 2020-12-23 NOTE — Telephone Encounter (Signed)
Patient returned call in regards to lab results.   Returned patients call. She has read her My Chart message. She reports she received a call to schedule her Diabetes Education on 1/18.   She is going to pick up her Iron supplements now. She has no further questions or concerns at this time.

## 2020-12-23 NOTE — Telephone Encounter (Signed)
Please let patient know her 2hr GTT was abnormal c/w GDM.  Order testing supplies and schedule with DM educator.  Please also send iron rx.

## 2020-12-30 ENCOUNTER — Encounter: Payer: Self-pay | Admitting: *Deleted

## 2020-12-31 ENCOUNTER — Other Ambulatory Visit: Payer: Self-pay

## 2020-12-31 ENCOUNTER — Other Ambulatory Visit: Payer: Medicaid Other

## 2020-12-31 ENCOUNTER — Encounter: Payer: Medicaid Other | Attending: Obstetrics & Gynecology | Admitting: Skilled Nursing Facility1

## 2020-12-31 ENCOUNTER — Encounter: Payer: Self-pay | Admitting: Skilled Nursing Facility1

## 2020-12-31 DIAGNOSIS — O24410 Gestational diabetes mellitus in pregnancy, diet controlled: Secondary | ICD-10-CM | POA: Insufficient documentation

## 2020-12-31 DIAGNOSIS — O24419 Gestational diabetes mellitus in pregnancy, unspecified control: Secondary | ICD-10-CM | POA: Insufficient documentation

## 2020-12-31 MED ORDER — ACCU-CHEK GUIDE W/DEVICE KIT: 1.0000 | PACK | Freq: Four times a day (QID) | 0 refills | Status: DC

## 2020-12-31 MED ORDER — GLUCOSE BLOOD VI STRP: ORAL_STRIP | 12 refills | Status: DC

## 2020-12-31 MED ORDER — ACCU-CHEK SOFTCLIX LANCETS MISC: 100.0000 | Freq: Four times a day (QID) | 12 refills | Status: DC

## 2020-12-31 NOTE — Progress Notes (Signed)
Patient was seen on 12/31/2020 for Gestational Diabetes self-management via [place] virtual visit pt was identified via DOB and name and pt verbally consents to this visit type with the understanding of its limitations. The following learning objectives were met by the patient during this course:  Pt states she is about to be 29 weeks long in a couple days. Pt states her weight has been stable having gained a couple pounds since her last appt.  Pt states she has been taking the prenatal daily and her iron supplement.  Pt states she has been well throughout her surgery. Pt states her glucometer is being sent to the wrong pharmacy: Dietitian advised pt to let her doctor know that.  Due to technological issues youtube video of how to use the accu check guide was sent to pt and her questions were answered. Pt states she has not been as active as she used to be. Pt states she eats bananas due to calf cramps. Pt states she does not like non starchy vegetables.  Pt states she has her stress level under control.    States the definition of Gestational Diabetes  States why dietary management is important in controlling blood glucose  Describes the effects each nutrient has on blood glucose levels  Demonstrates ability to create a balanced meal plan  Demonstrates carbohydrate counting   States when to check blood glucose levels  Demonstrates proper blood glucose monitoring techniques  States the effect of stress and exercise on blood glucose levels  States the importance of limiting caffeine and abstaining from alcohol and smoking  24 hr recall:  Breakfast egg + fruit + orange juice Snack: yogurt or jello or fruit Lunch: shrimp or chicken or pasta with rice or mac n cheese Snack:fruit Dinner: shrimp or chicken or or fish or pasta with rice or mac n cheese Snack: sometimes banana Beverages: water, sparkling water, orange jucie  Goals: Try some daily prenatal yoga on youtube  Check your  blood sugars 4 times a day: before breakfast and after each meal Go for a 30 minute walk most days of the week Avoid juice Avoid fruit in the morning  Aim for non starchy vegetables 2 times a day 7 days a week Aim for 4-5 bottles of water per day Be sure to log your blood sugar numbers Limit fruits to 3 servings per day  Patient instructed to monitor glucose levels: FBS: 60 - <95 1 hour: <140 2 hour: <120  *Patient received handouts:  Nutrition Diabetes and Pregnancy  Carbohydrate Counting List  Patient will be seen for follow-up as needed.

## 2021-01-10 ENCOUNTER — Telehealth (INDEPENDENT_AMBULATORY_CARE_PROVIDER_SITE_OTHER): Payer: Medicaid Other | Admitting: Certified Nurse Midwife

## 2021-01-10 ENCOUNTER — Other Ambulatory Visit: Payer: Self-pay

## 2021-01-10 VITALS — BP 130/54 | HR 88

## 2021-01-10 DIAGNOSIS — Z3480 Encounter for supervision of other normal pregnancy, unspecified trimester: Secondary | ICD-10-CM

## 2021-01-10 DIAGNOSIS — Z3A30 30 weeks gestation of pregnancy: Secondary | ICD-10-CM

## 2021-01-10 DIAGNOSIS — O24410 Gestational diabetes mellitus in pregnancy, diet controlled: Secondary | ICD-10-CM

## 2021-01-10 NOTE — Progress Notes (Signed)
OBSTETRICS PRENATAL VIRTUAL VISIT ENCOUNTER NOTE  Provider location: Center for [facility]   I connected with [name] on 01/10/21 at  9:15 AM EST by MyChart Video Encounter at home and verified that I am speaking with the correct person using two identifiers.   I discussed the limitations, risks, security and privacy concerns of performing an evaluation and management service virtually and the availability of in person appointments. I also discussed with the patient that there may be a patient responsible charge related to this service. The patient expressed understanding and agreed to proceed. Subjective:  Katrina Hester is a 25 y.o. G2P1001 at [redacted]w[redacted]d being seen today for ongoing prenatal care.  She is currently monitored for the following issues for this low-risk pregnancy and has Supervision of other normal pregnancy, antepartum and GDM (gestational diabetes mellitus) on their problem list.  Patient reports no complaints.  Contractions: Not present. Vag. Bleeding: None.  Movement: Present. Denies any leaking of fluid.   The following portions of the patient's history were reviewed and updated as appropriate: allergies, current medications, past family history, past medical history, past social history, past surgical history and problem list.   Objective:   Vitals:   01/10/21 0928  BP: (!) 130/54  Pulse: 88    Fetal Status:     Movement: Present     General:  Alert, oriented and cooperative. Patient is in no acute distress.  Respiratory: Normal respiratory effort, no problems with respiration noted  Mental Status: Normal mood and affect. Normal behavior. Normal judgment and thought content.  Rest of physical exam deferred due to type of encounter  Imaging: No results found.  Assessment and Plan:  Pregnancy: G2P1001 at [redacted]w[redacted]d 1. Supervision of other normal pregnancy, antepartum - Doing well, no complaints and feeling plenty of fetal  movement  2. [redacted] weeks gestation of pregnancy - Routine OB care - Pt had questions about doula care for herself and to support her husband. Reviewed the difference between a doula and midwife. - Pt qualifies for a Armed forces operational officer, will reach out to [facility] Natale Lay to inquire about availability for her due date. Also gave doula list in case pt decides to hire a doula outside the volunteer list  3. Diet controlled gestational diabetes mellitus (GDM) in third trimester - Pt has not started testing yet, her supplies were sent to her old pharmacy in [place], [place] to send them to her [facility] pharmacy - Emphasized importance of picking them up as soon as possible to begin testing so she can be aware of her triggers and keep her glucose balanced. Pt verbalized understanding, will pick up today if possible.   Preterm labor symptoms and general obstetric precautions including but not limited to vaginal bleeding, contractions, leaking of fluid and fetal movement were reviewed in detail with the patient. I discussed the assessment and treatment plan with the patient. The patient was provided an opportunity to ask questions and all were answered. The patient agreed with the plan and demonstrated an understanding of the instructions. The patient was advised to call back or seek an in-person office evaluation/go to MAU at [facility] for any urgent or concerning symptoms. Please refer to After Visit Summary for other counseling recommendations.   I provided 15 minutes of face-to-face time during this encounter.  Return in about 2 weeks (around 01/24/2021) for IN-PERSON, LOB.  Edd Arbour, CNM, MSN, IBCLC Certified Nurse Midwife, [facility]

## 2021-01-10 NOTE — Patient Instructions (Signed)
DOULA LIST   [facility] Doula  [place]  [phone]  [email or web address]  [email or web address]  Zula the [employer] Price [phone]  [email or web address]   [employer]   Precious Danford Bad   [email or web address]   ??THE MOTHERLY DOULA?? Zola Button   [phone]   [email or web address]     [employer] Doula  Olive Bass  [phone]    [email or web address]   [employer] [facility]  Angie Rosier     [phone]     [email or web address]   Renato Gails: Doula & Photographer   Renato Gails [phone]       [email or web address]  [email or web address]   [employer] Doula [facility]  [place] [phone]   [email or web address]   [place]  Lolita Rieger  [phone]  [email or web address]   [email or web address]   [facility]   Kizzie Furnish   [phone]  [email or web address]   Dina Rich [place] Doula  Dina Rich  [phone] [email or web address]  [facility] Doulas  Cornelious Bryant         [facility]       Lora Reynolds     [phone] [email or web address]  [email or web address]   [facility]   Paonia Foxx [phone] [email or web address]   [facility]  Camelia Eng     [phone]  [email or web address]  [facility]     [phone]  [email or web address]  Facebook and IG [email or web address] .  [phone] [phone] [email or web address]   [phone]  [phone] [email or web address]     [facility]  [phone] [email or web address]   IG [email or web address]    [phone]    [phone]  [email or web address]   Lurline Hare "[place]onia" Free  [phone] [email or web address]    Mtende Roll  [phone] [email or web address]   Susie Williams   [email or web address]    [phone]    [phone] [email or web address]     Denita Lung  [phone] [email or web address] Galloway Rhem    [phone]   [facility] Jaquita Folds  [phone] [email or web address]  Doula Mama [phone] [email or web address]  [facility] Maryjean Ka  [phone] [email or web address]  [facility] [phone]  [email or web address]  [email or web address] Montgomery Harris-Jones  [phone] [email or web address] .[email or web address]   Allen Kell [phone] [email or web address] .Ardine Eng   [phone] [email or web address]   Gilda Crease  [phone] [email or web address]  [employer]  Jonelle Sports  [phone] [email or web address]   Jackelyn Poling [phone] [email or web address]  MOOR Divine Myeka Dunn  [email or web address]   Cristina Gong [phone] [email or web address]   Glory Buff [phone] [email or web address]   Whitney Muse [phone] [email or web address]

## 2021-01-25 IMAGING — US US OB < 14 WEEKS - US OB TV
1 series · 14 of 28 positions shown · non-contrast
Comparison: None.

CLINICAL DATA: Unsure of LMP.

EXAM:
OBSTETRIC <14 WK US AND TRANSVAGINAL OB US
TECHNIQUE: Both transabdominal and transvaginal ultrasound examinations were
performed for complete evaluation of the gestation as well as the
maternal uterus, adnexal regions, and pelvic cul-de-sac.
Transvaginal technique was performed to assess early pregnancy.

[Series 1: us ob less than 14 weeks with ob transvaginal · 14 of 162 slices shown]
[im 6/162]
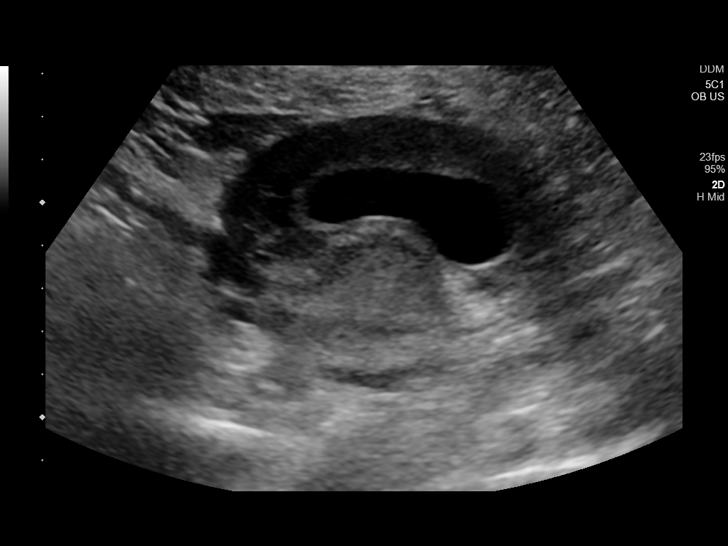
[im 18/162]
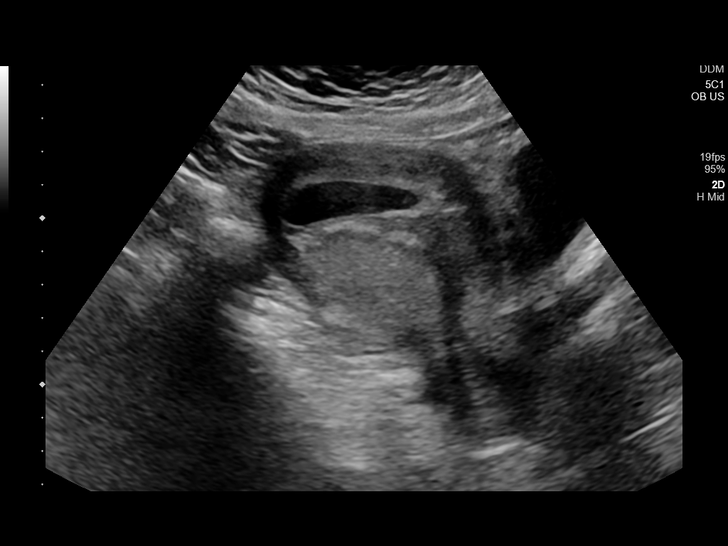
[im 30/162]
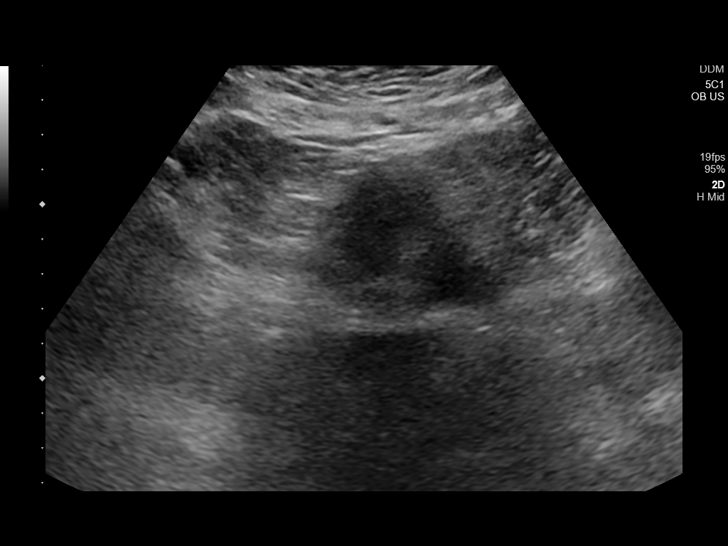
[im 42/162]
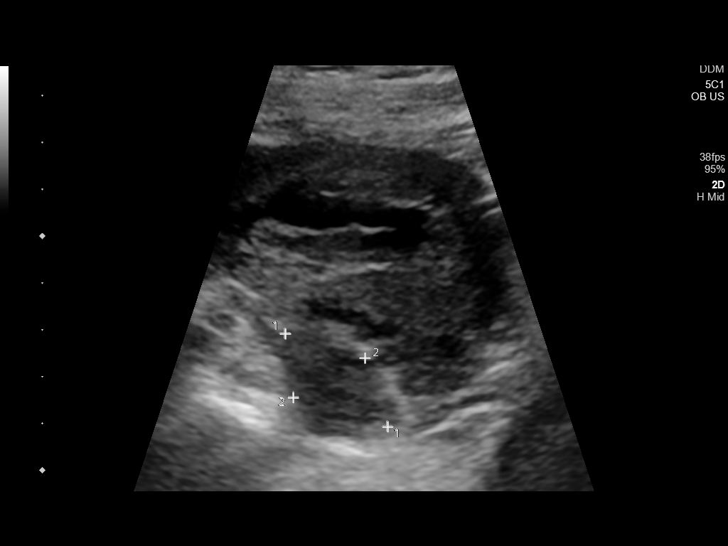
[im 54/162]
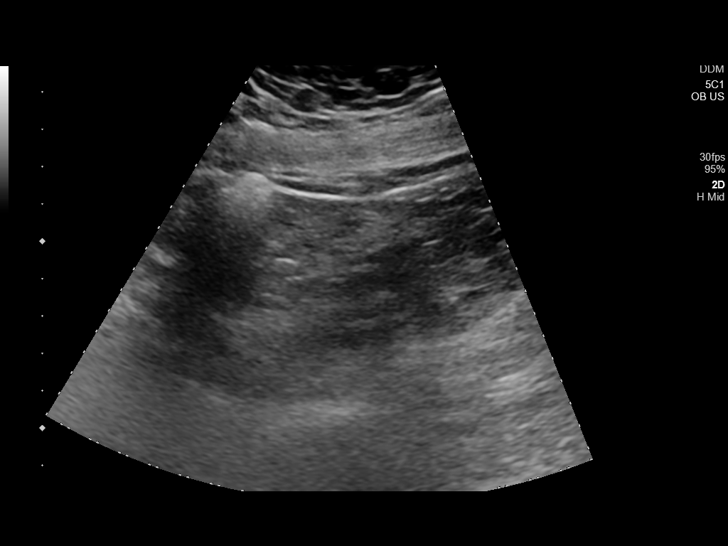
[im 66/162]
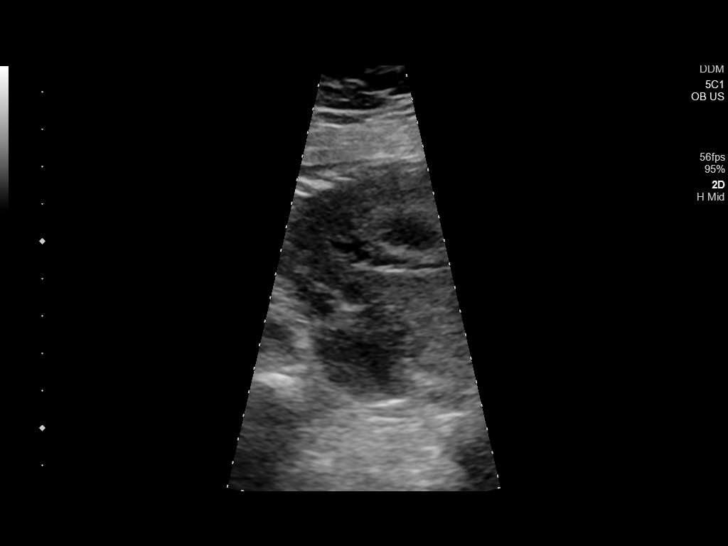
[im 78/162]
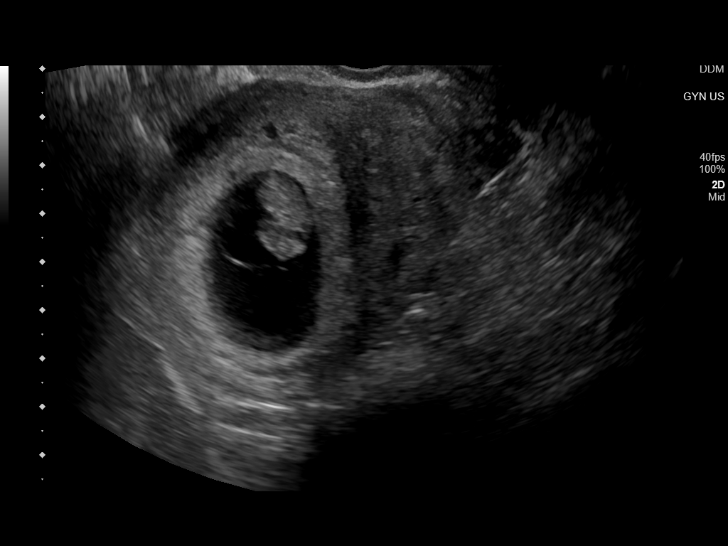
[im 90/162]
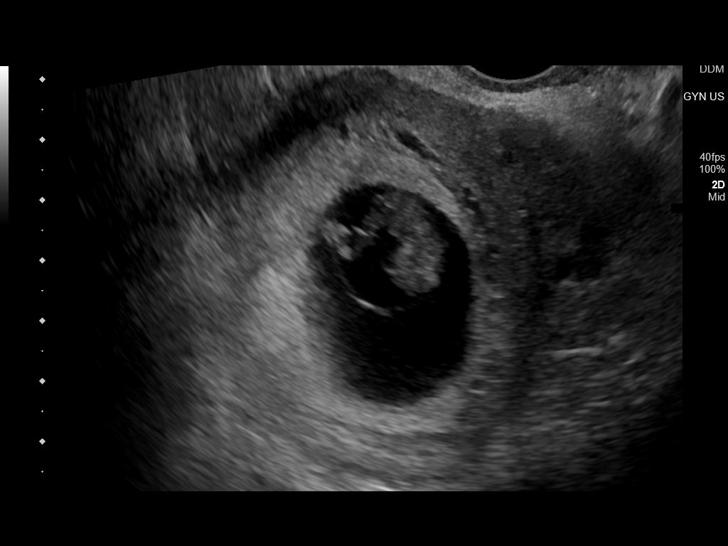
[im 102/162]
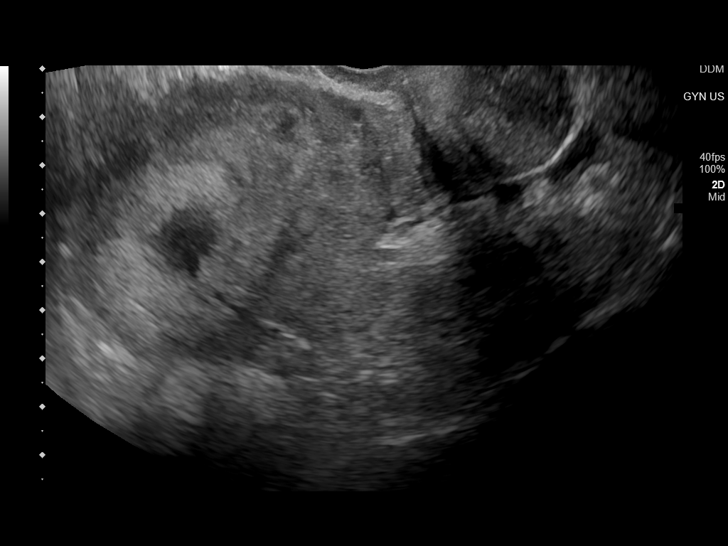
[im 114/162]
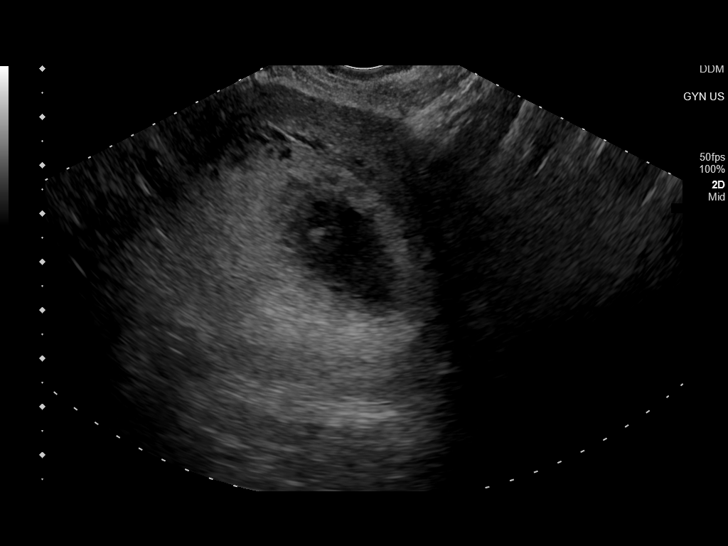
[im 126/162]
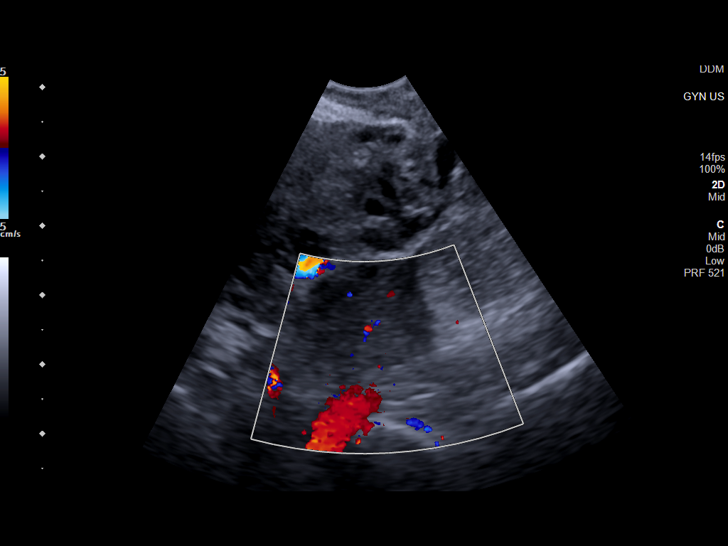
[im 138/162]
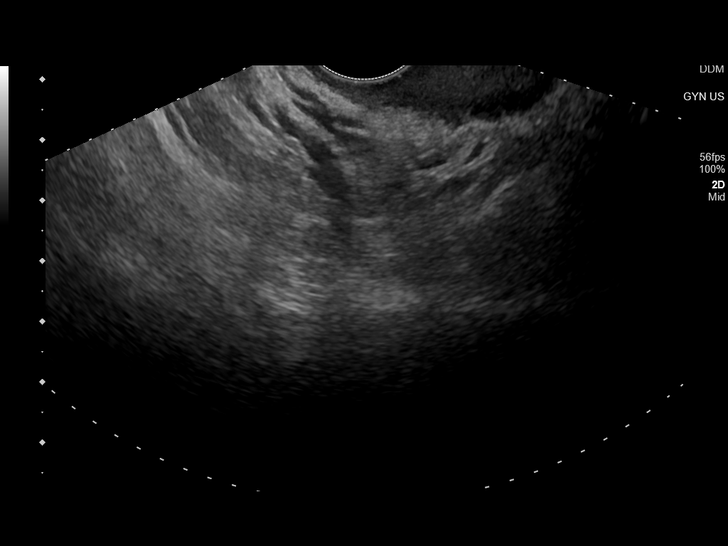
[im 150/162]
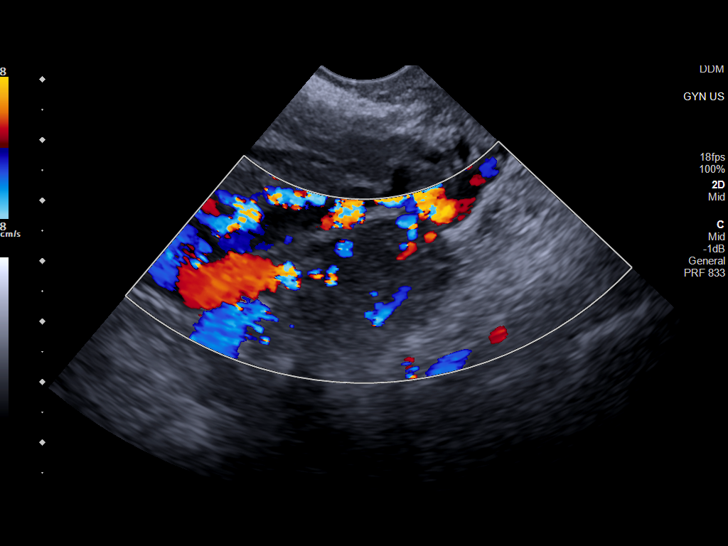
[im 162/162]
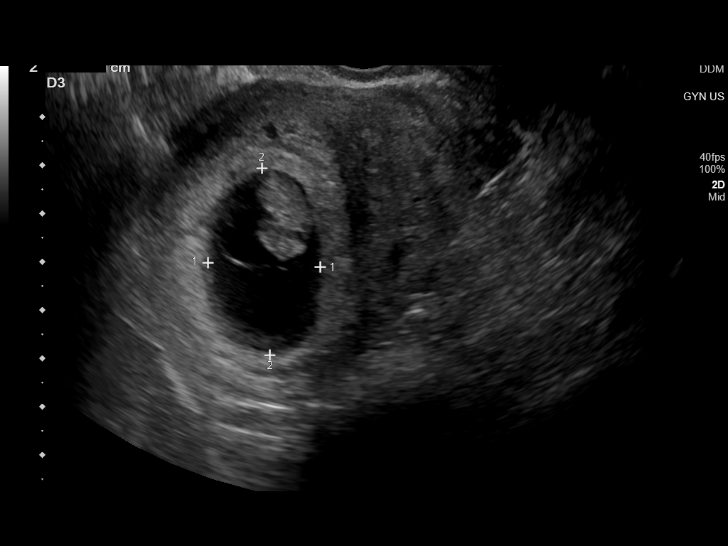

[14 of 28 positions shown; findings below may reference images not displayed]

FINDINGS: Intrauterine gestational sac: Single

Yolk sac:  Visualized.

Embryo:  Visualized.

Cardiac Activity: Visualized.

Heart Rate: 166 bpm

CRL:  19 mm   8 w   3 d                  US EDC: 03/16/2021

Subchorionic hemorrhage:  None visualized.

Maternal uterus/adnexae: Both ovaries are normal in appearance. No
mass or abnormal free fluid identified.
IMPRESSION: Single living IUP with estimated gestational age of 8 weeks 3 days,
and US EDC of 03/16/2021.

## 2021-02-04 ENCOUNTER — Other Ambulatory Visit: Payer: Self-pay

## 2021-02-04 ENCOUNTER — Ambulatory Visit (INDEPENDENT_AMBULATORY_CARE_PROVIDER_SITE_OTHER): Payer: Medicaid Other | Admitting: Family Medicine

## 2021-02-04 ENCOUNTER — Encounter: Payer: Self-pay | Admitting: Family Medicine

## 2021-02-04 DIAGNOSIS — Z3480 Encounter for supervision of other normal pregnancy, unspecified trimester: Secondary | ICD-10-CM

## 2021-02-04 NOTE — Progress Notes (Signed)
1030 call to tell patient to come into building and patient stated that she already left and would like to reschedule. Keyonni Percival CMA

## 2021-02-04 NOTE — Progress Notes (Signed)
Not seen, patient left prior to being seen, she was not roomed within 1 hour of her visit.

## 2021-02-11 ENCOUNTER — Ambulatory Visit (INDEPENDENT_AMBULATORY_CARE_PROVIDER_SITE_OTHER): Payer: Medicaid Other | Admitting: Family Medicine

## 2021-02-11 ENCOUNTER — Encounter: Payer: Self-pay | Admitting: Family Medicine

## 2021-02-11 ENCOUNTER — Other Ambulatory Visit: Payer: Self-pay

## 2021-02-11 VITALS — BP 115/53 | HR 96 | Wt 154.8 lb

## 2021-02-11 DIAGNOSIS — O24419 Gestational diabetes mellitus in pregnancy, unspecified control: Secondary | ICD-10-CM

## 2021-02-11 DIAGNOSIS — O99019 Anemia complicating pregnancy, unspecified trimester: Secondary | ICD-10-CM

## 2021-02-11 DIAGNOSIS — Z3480 Encounter for supervision of other normal pregnancy, unspecified trimester: Secondary | ICD-10-CM

## 2021-02-11 MED ORDER — ACCU-CHEK SOFTCLIX LANCETS MISC: 100.0000 | Freq: Four times a day (QID) | 12 refills | Status: AC

## 2021-02-11 MED ORDER — GLUCOSE BLOOD VI STRP: ORAL_STRIP | 12 refills | Status: AC

## 2021-02-11 MED ORDER — ACCU-CHEK GUIDE W/DEVICE KIT: 1.0000 | PACK | Freq: Four times a day (QID) | 0 refills | Status: AC

## 2021-02-11 NOTE — Progress Notes (Signed)
   PRENATAL VISIT NOTE  Subjective:  Katrina Hester is a 25 y.o. G2P1001 at [redacted]w[redacted]d being seen today for ongoing prenatal care.  She is currently monitored for the following issues for this high-risk pregnancy and has Supervision of other normal pregnancy, antepartum; GDM (gestational diabetes mellitus); and Anemia of pregnancy on their problem list.  Patient reports no complaints.  Contractions: Irregular. Vag. Bleeding: None.  Movement: Present. Denies leaking of fluid.   The following portions of the patient's history were reviewed and updated as appropriate: allergies, current medications, past family history, past medical history, past social history, past surgical history and problem list.   Objective:   Vitals:   02/11/21 1612  BP: (!) 115/53  Pulse: 96  Weight: 154 lb 12.8 oz (70.2 kg)    Fetal Status: Fetal Heart Rate (bpm): 150   Movement: Present     General:  Alert, oriented and cooperative. Patient is in no acute distress.  Skin: Skin is warm and dry. No rash noted.   Cardiovascular: Normal heart rate noted  Respiratory: Normal respiratory effort, no problems with respiration noted  Abdomen: Soft, gravid, appropriate for gestational age.  Pain/Pressure: Present     Pelvic: Cervical exam deferred        Extremities: Normal range of motion.  Edema: None  Mental Status: Normal mood and affect. Normal behavior. Normal judgment and thought content.   Assessment and Plan:  Pregnancy: G2P1001 at [redacted]w[redacted]d 1. Supervision of other normal pregnancy, antepartum -doing well without complaints, VSS -taking PNV -discussed contraception, patient declines  2. Gestational diabetes mellitus (GDM), antepartum, gestational diabetes method of control unspecified -Patient has not picked up testing supplies, sent to correct pharmacy today -BGL log not provided today -Patient has not had growth u/s since 10/2020, ordered today -Discussed extensive risk of pregnancy with DM and not checking  BGL, patient will pick up supplies today and appt scheduled with diabetes educator.  3. Anemia of pregnancy Last hgb 12/20/20, hgb 9.9. taking PO iron.  Preterm labor symptoms and general obstetric precautions including but not limited to vaginal bleeding, contractions, leaking of fluid and fetal movement were reviewed in detail with the patient. Please refer to After Visit Summary for other counseling recommendations.   No follow-ups on file.  Future Appointments  Date Time Provider Department Center  02/25/2021 12:30 PM [facility] NURSE [facility]  02/25/2021 12:45 PM [facility]4 [facility]    Alric Seton, MD

## 2021-02-11 NOTE — Progress Notes (Signed)
Patient stated that she feels sting type of pain when she's moving/walking, happens every other day. 115/53

## 2021-02-17 ENCOUNTER — Other Ambulatory Visit: Payer: Self-pay

## 2021-02-17 ENCOUNTER — Other Ambulatory Visit (HOSPITAL_COMMUNITY)
Admission: RE | Admit: 2021-02-17 | Discharge: 2021-02-17 | Disposition: A | Discharge status: Home / Self Care | Payer: Medicaid Other | Source: Ambulatory Visit | Attending: Obstetrics and Gynecology | Admitting: Obstetrics and Gynecology

## 2021-02-17 ENCOUNTER — Encounter: Payer: Self-pay | Admitting: Obstetrics and Gynecology

## 2021-02-17 ENCOUNTER — Ambulatory Visit (INDEPENDENT_AMBULATORY_CARE_PROVIDER_SITE_OTHER): Payer: Medicaid Other | Admitting: Obstetrics and Gynecology

## 2021-02-17 VITALS — BP 114/56 | HR 96 | Wt 155.5 lb

## 2021-02-17 DIAGNOSIS — Z3480 Encounter for supervision of other normal pregnancy, unspecified trimester: Secondary | ICD-10-CM | POA: Insufficient documentation

## 2021-02-17 DIAGNOSIS — O24410 Gestational diabetes mellitus in pregnancy, diet controlled: Secondary | ICD-10-CM

## 2021-02-17 DIAGNOSIS — O99013 Anemia complicating pregnancy, third trimester: Secondary | ICD-10-CM

## 2021-02-17 LAB — POCT URINALYSIS DIP (DEVICE)
Bilirubin Urine: NEGATIVE
Glucose, UA: NEGATIVE mg/dL
Hgb urine dipstick: NEGATIVE
Ketones, ur: NEGATIVE mg/dL
Nitrite: NEGATIVE
Protein, ur: NEGATIVE mg/dL
Specific Gravity, Urine: >=1.03 (ref 1.005–1.030)
Urobilinogen, UA: 0.2 mg/dL (ref 0.0–1.0)
pH: 7 (ref 5.0–8.0)

## 2021-02-17 NOTE — Progress Notes (Signed)
   PRENATAL VISIT NOTE  Subjective:  Katrina Hester is a 25 y.o. G2P1001 at [redacted]w[redacted]d being seen today for ongoing prenatal care.  She is currently monitored for the following issues for this high-risk pregnancy and has Supervision of other normal pregnancy, antepartum; GDM (gestational diabetes mellitus); and Anemia of pregnancy on their problem list.  Patient reports no complaints.  Contractions: Irritability. Vag. Bleeding: None.  Movement: Present. Denies leaking of fluid.   The following portions of the patient's history were reviewed and updated as appropriate: allergies, current medications, past family history, past medical history, past social history, past surgical history and problem list.   Objective:   Vitals:   02/17/21 0927  BP: (!) 114/56  Pulse: 96  Weight: 155 lb 8 oz (70.5 kg)    Fetal Status: Fetal Heart Rate (bpm): 145 Fundal Height: 36 cm Movement: Present     General:  Alert, oriented and cooperative. Patient is in no acute distress.  Skin: Skin is warm and dry. No rash noted.   Cardiovascular: Normal heart rate noted  Respiratory: Normal respiratory effort, no problems with respiration noted  Abdomen: Soft, gravid, appropriate for gestational age.  Pain/Pressure: Present     Pelvic: Cervical exam performed in the presence of a chaperone Dilation: Fingertip Effacement (%): Thick Station: Ballotable  Extremities: Normal range of motion.  Edema: None  Mental Status: Normal mood and affect. Normal behavior. Normal judgment and thought content.   Assessment and Plan:  Pregnancy: G2P1001 at [redacted]w[redacted]d 1. Supervision of other normal pregnancy, antepartum Patient is doing well without complaints - Culture, beta strep (group b only) - Cervicovaginal ancillary only( [place])  2. Diet controlled gestational diabetes mellitus (GDM) in third trimester CBGs reviewed and all fasting except 1 within range with highest value 99 and greater than 50 % pp within range with  highest pp 133 Patient scheduled for diabetic education tomorrow Growth ultrasound scheduled on 3/15  3. Anemia during pregnancy in third trimester Continue iron supplement  Preterm labor symptoms and general obstetric precautions including but not limited to vaginal bleeding, contractions, leaking of fluid and fetal movement were reviewed in detail with the patient. Please refer to After Visit Summary for other counseling recommendations.   No follow-ups on file.  Future Appointments  Date Time Provider Department Center  02/18/2021  3:15 PM [facility]  02/25/2021 12:30 PM [facility] NURSE [facility]  02/25/2021 12:45 PM [facility] US4 [facility]    Catalina Antigua, MD

## 2021-02-18 ENCOUNTER — Encounter: Payer: Medicaid Other | Attending: Obstetrics & Gynecology | Admitting: Registered"

## 2021-02-18 ENCOUNTER — Ambulatory Visit: Payer: Medicaid Other | Admitting: Registered"

## 2021-02-18 DIAGNOSIS — O24410 Gestational diabetes mellitus in pregnancy, diet controlled: Secondary | ICD-10-CM | POA: Insufficient documentation

## 2021-02-18 DIAGNOSIS — O24419 Gestational diabetes mellitus in pregnancy, unspecified control: Secondary | ICD-10-CM

## 2021-02-18 LAB — CERVICOVAGINAL ANCILLARY ONLY
Chlamydia: NEGATIVE
Comment: NEGATIVE
Comment: NORMAL
Neisseria Gonorrhea: NEGATIVE

## 2021-02-20 NOTE — Progress Notes (Signed)
Patient was seen on 02/18/21 for Gestational Diabetes self-management. EDD 03/16/21; [redacted]w[redacted]d . Patient states no history of GDM. Diet history obtained. Patient eats variety of all food groups. Patient may need to reduce carb intake from fruit (mango), tortillas, rice.    The following learning objectives were met by the patient :   States the definition of Gestational Diabetes  States why dietary management is important in controlling blood glucose  Describes the effects of carbohydrates on blood glucose levels  Demonstrates ability to create a balanced meal plan  Demonstrates carbohydrate counting   States when to check blood glucose levels  Demonstrates proper blood glucose monitoring techniques  States the effect of stress and exercise on blood glucose levels  States the importance of limiting caffeine and abstaining from alcohol and smoking  Plan:  Aim for 3 Carbohydrate Choices per meal (45 grams) +/- 1 either way  Aim for 1-2 Carbohydrate Choices per snack Begin reading food labels for Total Carbohydrate of foods If OK with your MD, consider  increasing your activity level by walking, Arm Chair Exercises or other activity daily as tolerated Begin checking Blood Glucose before breakfast and 2 hours after first bite of breakfast, lunch and dinner as directed by MD  Bring Log Book/Sheet and meter to every medical appointment  Baby Scripts: (BS 2.0 not capable of glucose management at this time.) Patient to record blood sugar on glucose log sheet  Take medication if directed by MD  Blood glucose monitor given: Accu-chek Guide Me Lot [id number]: 01/13/2022 CBG: 130 mg/dL  Rx order placed for  Accu-chek Guide strips and Softclix lancets  Patient instructed to monitor glucose levels: FBS: 60 - 95 mg/dl 2 hour: <120 mg/dl  Patient received the following handouts:  Nutrition Diabetes and Pregnancy  Carbohydrate Counting List  Blood glucose Log Sheet  Patient will be seen  for follow-up as needed.

## 2021-02-21 LAB — CULTURE, BETA STREP (GROUP B ONLY): Strep Gp B Culture: NEGATIVE

## 2021-02-24 ENCOUNTER — Ambulatory Visit (INDEPENDENT_AMBULATORY_CARE_PROVIDER_SITE_OTHER): Payer: Medicaid Other | Admitting: Obstetrics and Gynecology

## 2021-02-24 ENCOUNTER — Other Ambulatory Visit: Payer: Self-pay

## 2021-02-24 VITALS — BP 115/64 | HR 97 | Wt 157.0 lb

## 2021-02-24 DIAGNOSIS — O99013 Anemia complicating pregnancy, third trimester: Secondary | ICD-10-CM

## 2021-02-24 DIAGNOSIS — Z3A37 37 weeks gestation of pregnancy: Secondary | ICD-10-CM | POA: Insufficient documentation

## 2021-02-24 DIAGNOSIS — O24410 Gestational diabetes mellitus in pregnancy, diet controlled: Secondary | ICD-10-CM

## 2021-02-24 DIAGNOSIS — Z3480 Encounter for supervision of other normal pregnancy, unspecified trimester: Secondary | ICD-10-CM

## 2021-02-24 NOTE — Patient Instructions (Signed)
Gestational Diabetes Mellitus, Self-Care When you have gestational diabetes mellitus, you must make sure your blood sugar (glucose) stays at a healthy level. What are the risks? If you do not get treated for this condition, it may cause problems for you and your unborn baby. For the mother  Giving birth to the baby early.  Having problems during labor and when giving birth.  Needing surgery to give birth to the baby (cesarean delivery).  Having problems with blood pressure.  Getting this form of diabetes again when pregnant.  Getting type 2 diabetes in the future. For the baby  Low blood sugar.  Bigger body size than is normal.  Breathing problems. How to monitor blood sugar Check your blood sugar every day while you are pregnant. Check it as often as told by your doctor. To do this: 1. Wash your hands with soap and water for at least 20 seconds. 2. Prick the side of your finger (not the tip) with the lancet. Use a different finger each time. 3. Gently rub the finger until a small drop of blood appears. 4. Follow instructions that come with your meter for: ? Putting in the test strip. ? Putting blood on the strip. ? Getting the result. 5. Write down your result and any notes. In general, your blood sugar levels should be:  95 mg/dL (5.3 mmol/L) if you have not eaten.  140 mg/dL (7.8 mmol/L) 1 hour after a meal.  120 mg/dL (6.7 mmol/L) 2 hours after a meal.   Follow these instructions at home: Medicines  Take over-the-counter and prescription medicines only as told by your doctor.  If your doctor prescribed insulin or other diabetes medicines: ? Take them every day. ? Do not run out of insulin or other medicines. Plan ahead so you always have them. Eating and drinking  Follow instructions from your doctor about eating or drinking restrictions.  See a food expert (dietician) to help you create an eating plan that helps control your blood sugar. The foods in this  plan will include: ? Low-fat proteins. ? Dried beans, nuts, and whole grain breads, cereals, or pasta. ? Fresh fruits and vegetables. ? Low-fat dairy products. ? Healthy fats.  Eat healthy snacks between healthy meals.  Drink enough fluid to keep your pee (urine) pale yellow.  Keep track of carbs that you eat. To do this: ? Read food labels. ? Learn the serving sizes of foods.  Follow your sick day plan when you cannot eat or drink normally. Make this plan with your doctor so it is ready to use.   Activity  Do exercises as told by your doctor.  Exercise for 30 or more minutes a day, or as much as your doctor recommends. To help you control blood sugar levels after a meal: ? Do 10 minutes of exercise after each meal. ? Start this exercise 30 minutes after the meal.  Talk with your doctor before you start a new exercise. Your doctor may tell you to change your insulin, other medicines, or food. Lifestyle  Do not drink alcohol.  Do not use any products that contain nicotine or tobacco, such as cigarettes, e-cigarettes, and chewing tobacco. If you need help quitting, ask your doctor.  Learn how to deal with stress. If you need help with this, ask your doctor. Body care  Stay up to date with your shots (vaccines).  Take good care of your teeth. To do this: ? Brush your teeth and gums two times a day. ?   Floss one or more times a day. ? Go to the dentist one or more times every 6 months.  Stay at a healthy weight while you are pregnant. General instructions  Ask your doctor about risks of high blood pressure in pregnancy.  Share your diabetes care plan with: ? Your work or school. ? People you live with.  Check your pee for ketones: ? When you are sick. ? As told by your doctor.  Carry a card or wear a bracelet that says you have diabetes.  Keep all follow-up visits. Care after giving birth  Have your blood sugar checked 4-12 weeks after you give birth.  Get  checked for diabetes one or more times every 3 years or as told. Where to find more information  American Diabetes Association (ADA): diabetes.org  Association of Diabetes Care & Education Specialists (ADCES): diabeteseducator.org  Centers for Disease Control and Prevention (CDC): cdc.gov  American Pregnancy Association: americanpregnancy.org  U.S. Department of Agriculture MyPlate: myplate.gov Contact a doctor if:  Your blood sugar is above your target for two tests in a row.  You have a fever.  You are sick for 2 days or more and do not get better.  You have either of these problems for more than 6 hours: ? You vomit every time you eat or drink. ? You have watery poop (diarrhea). Get help right away if:  You cannot think clearly.  You have trouble breathing.  You have moderate or high ketones in your pee.  Blood or abnormal fluid starts to come out of your vagina.  You feel your baby is not moving as usual.  You start having early contractions. You may feel your belly tighten.  You have a very bad headache. These symptoms may be an emergency. Get help right away. Call your local emergency services (911 in the [place]  Do not wait to see if the symptoms will go away.  Do not drive yourself to the hospital. Summary  Check your blood sugar (glucose) while you are pregnant. Check it as often as told by your doctor.  Take your insulin and diabetes medicines as told.  Have your blood sugar checked 4-12 weeks after you give birth.  Keep all follow-up visits. This information is not intended to replace advice given to you by your health care provider. Make sure you discuss any questions you have with your health care provider. Document Revised: 05/06/2020 Document Reviewed: 05/06/2020 Elsevier Patient Education  2021 Elsevier Inc.  

## 2021-02-24 NOTE — Progress Notes (Signed)
   PRENATAL VISIT NOTE  Subjective:  Katrina Hester is a 25 y.o. G2P1001 at [redacted]w[redacted]d being seen today for ongoing prenatal care.  She is currently monitored for the following issues for this high-risk pregnancy and has Supervision of other normal pregnancy, antepartum; GDM (gestational diabetes mellitus); Anemia of pregnancy; and [redacted] weeks gestation of pregnancy on their problem list.  Patient doing well with no acute concerns today. She reports no complaints.  Contractions: Irritability. Vag. Bleeding: None.  Movement: Present. Denies leaking of fluid.   FBS 80-94 PPBS 88-120  The following portions of the patient's history were reviewed and updated as appropriate: allergies, current medications, past family history, past medical history, past social history, past surgical history and problem list. Problem list updated.  Objective:   Vitals:   02/24/21 0923  BP: 115/64  Pulse: 97  Weight: 157 lb (71.2 kg)    Fetal Status: Fetal Heart Rate (bpm): 159 Fundal Height: 37 cm Movement: Present     General:  Alert, oriented and cooperative. Patient is in no acute distress.  Skin: Skin is warm and dry. No rash noted.   Cardiovascular: Normal heart rate noted  Respiratory: Normal respiratory effort, no problems with respiration noted  Abdomen: Soft, gravid, appropriate for gestational age.  Pain/Pressure: Present     Pelvic: Cervical exam deferred        Extremities: Normal range of motion.  Edema: None  Mental Status:  Normal mood and affect. Normal behavior. Normal judgment and thought content.   Assessment and Plan:  Pregnancy: G2P1001 at [redacted]w[redacted]d  1. Supervision of other normal pregnancy, antepartum Pt has u/s on 02/25/2021  2. Anemia during pregnancy in third trimester   3. Diet controlled gestational diabetes mellitus (GDM) in third trimester Excellent blood sugar control and compliance  4. [redacted] weeks gestation of pregnancy   Term labor symptoms and general obstetric precautions  including but not limited to vaginal bleeding, contractions, leaking of fluid and fetal movement were reviewed in detail with the patient.  Please refer to After Visit Summary for other counseling recommendations.   Return in about 1 week (around 03/03/2021) for [facility], in person.   Mariel Aloe, MD Faculty Attending Center for [facility]

## 2021-02-25 ENCOUNTER — Encounter: Payer: Self-pay | Admitting: *Deleted

## 2021-02-25 ENCOUNTER — Ambulatory Visit: Payer: Medicaid Other | Admitting: *Deleted

## 2021-02-25 ENCOUNTER — Ambulatory Visit: Payer: Medicaid Other | Attending: Family Medicine

## 2021-02-25 DIAGNOSIS — O24419 Gestational diabetes mellitus in pregnancy, unspecified control: Secondary | ICD-10-CM | POA: Insufficient documentation

## 2021-02-25 DIAGNOSIS — Z3480 Encounter for supervision of other normal pregnancy, unspecified trimester: Secondary | ICD-10-CM | POA: Insufficient documentation

## 2021-02-25 LAB — POCT URINALYSIS DIP (DEVICE)
Bilirubin Urine: NEGATIVE
Glucose, UA: 100 mg/dL — AB
Hgb urine dipstick: NEGATIVE
Ketones, ur: NEGATIVE mg/dL
Nitrite: NEGATIVE
Protein, ur: NEGATIVE mg/dL
Specific Gravity, Urine: >=1.03 (ref 1.005–1.030)
Urobilinogen, UA: 1 mg/dL (ref 0.0–1.0)
pH: 7 (ref 5.0–8.0)

## 2021-03-05 ENCOUNTER — Other Ambulatory Visit: Payer: Self-pay

## 2021-03-05 ENCOUNTER — Other Ambulatory Visit: Payer: Self-pay | Admitting: Advanced Practice Midwife

## 2021-03-05 ENCOUNTER — Encounter: Payer: Self-pay | Admitting: Obstetrics and Gynecology

## 2021-03-05 ENCOUNTER — Ambulatory Visit (INDEPENDENT_AMBULATORY_CARE_PROVIDER_SITE_OTHER): Payer: Medicaid Other | Admitting: Obstetrics and Gynecology

## 2021-03-05 VITALS — BP 121/74 | HR 91 | Wt 159.4 lb

## 2021-03-05 DIAGNOSIS — Z3480 Encounter for supervision of other normal pregnancy, unspecified trimester: Secondary | ICD-10-CM

## 2021-03-05 DIAGNOSIS — O24410 Gestational diabetes mellitus in pregnancy, diet controlled: Secondary | ICD-10-CM

## 2021-03-05 DIAGNOSIS — O99013 Anemia complicating pregnancy, third trimester: Secondary | ICD-10-CM

## 2021-03-05 MED ORDER — FERROUS SULFATE 325 (65 FE) MG PO TABS: 325.0000 mg | ORAL_TABLET | Freq: Every day | ORAL | 1 refills | Status: DC

## 2021-03-05 NOTE — Patient Instructions (Signed)

## 2021-03-05 NOTE — Progress Notes (Signed)
Subjective:  Katrina Hester is a 25 y.o. G2P1001 at [redacted]w[redacted]d being seen today for ongoing prenatal care.  She is currently monitored for the following issues for this high-risk pregnancy and has Supervision of other normal pregnancy, antepartum; GDM (gestational diabetes mellitus); and Anemia of pregnancy on their problem list.  Patient reports general discomforts of pregnancy.  Contractions: Irritability. Vag. Bleeding: None.  Movement: Present. Denies leaking of fluid.   The following portions of the patient's history were reviewed and updated as appropriate: allergies, current medications, past family history, past medical history, past social history, past surgical history and problem list. Problem list updated.  Objective:   Vitals:   03/05/21 1116  BP: 121/74  Pulse: 91  Weight: 159 lb 6.4 oz (72.3 kg)    Fetal Status: Fetal Heart Rate (bpm): 164   Movement: Present     General:  Alert, oriented and cooperative. Patient is in no acute distress.  Skin: Skin is warm and dry. No rash noted.   Cardiovascular: Normal heart rate noted  Respiratory: Normal respiratory effort, no problems with respiration noted  Abdomen: Soft, gravid, appropriate for gestational age. Pain/Pressure: Present     Pelvic:  Cervical exam deferred        Extremities: Normal range of motion.  Edema: Trace  Mental Status: Normal mood and affect. Normal behavior. Normal judgment and thought content.   Urinalysis:      Assessment and Plan:  Pregnancy: G2P1001 at [redacted]w[redacted]d  1. Supervision of other normal pregnancy, antepartum Stable IOL scheduled for 03/09/21 Labor precautions reviewed  2. Diet controlled gestational diabetes mellitus (GDM) in third trimester CBG's in goal range Growth 97 % on 02/25/21 Continue with diet  3. Anemia during pregnancy in third trimester  - ferrous sulfate 325 (65 FE) MG tablet; Take 1 tablet (325 mg total) by mouth daily with breakfast. Take with Vitamin C  Dispense: 30 tablet;  Refill: 1  Term labor symptoms and general obstetric precautions including but not limited to vaginal bleeding, contractions, leaking of fluid and fetal movement were reviewed in detail with the patient. Please refer to After Visit Summary for other counseling recommendations.  No follow-ups on file.   Hermina Staggers, MD

## 2021-03-06 ENCOUNTER — Telehealth (HOSPITAL_COMMUNITY): Payer: Self-pay | Admitting: *Deleted

## 2021-03-06 ENCOUNTER — Encounter (HOSPITAL_COMMUNITY): Payer: Self-pay | Admitting: *Deleted

## 2021-03-06 NOTE — Telephone Encounter (Signed)
Preadmission screen  

## 2021-03-07 ENCOUNTER — Other Ambulatory Visit (HOSPITAL_COMMUNITY)
Admission: RE | Admit: 2021-03-07 | Discharge: 2021-03-07 | Disposition: A | Discharge status: Home / Self Care | Payer: Medicaid Other | Source: Ambulatory Visit | Attending: Obstetrics and Gynecology | Admitting: Obstetrics and Gynecology

## 2021-03-07 DIAGNOSIS — Z01812 Encounter for preprocedural laboratory examination: Secondary | ICD-10-CM | POA: Insufficient documentation

## 2021-03-07 DIAGNOSIS — Z20822 Contact with and (suspected) exposure to covid-19: Secondary | ICD-10-CM | POA: Diagnosis not present

## 2021-03-07 LAB — SARS CORONAVIRUS 2 (TAT 6-24 HRS): SARS Coronavirus 2: NEGATIVE

## 2021-03-09 ENCOUNTER — Inpatient Hospital Stay (HOSPITAL_COMMUNITY)
Admission: AD | Admit: 2021-03-09 | Discharge: 2021-03-11 | DRG: 807 | Disposition: A | Discharge status: Home / Self Care | Payer: Medicaid Other | Attending: Obstetrics and Gynecology | Admitting: Obstetrics and Gynecology

## 2021-03-09 ENCOUNTER — Inpatient Hospital Stay (HOSPITAL_COMMUNITY): Payer: Medicaid Other

## 2021-03-09 ENCOUNTER — Encounter (HOSPITAL_COMMUNITY): Payer: Self-pay | Admitting: Obstetrics and Gynecology

## 2021-03-09 ENCOUNTER — Other Ambulatory Visit: Payer: Self-pay

## 2021-03-09 DIAGNOSIS — O3663X0 Maternal care for excessive fetal growth, third trimester, not applicable or unspecified: Secondary | ICD-10-CM | POA: Diagnosis present

## 2021-03-09 DIAGNOSIS — O24410 Gestational diabetes mellitus in pregnancy, diet controlled: Secondary | ICD-10-CM | POA: Diagnosis present

## 2021-03-09 DIAGNOSIS — O24419 Gestational diabetes mellitus in pregnancy, unspecified control: Secondary | ICD-10-CM | POA: Diagnosis present

## 2021-03-09 DIAGNOSIS — O24420 Gestational diabetes mellitus in childbirth, diet controlled: Principal | ICD-10-CM | POA: Diagnosis present

## 2021-03-09 DIAGNOSIS — O99019 Anemia complicating pregnancy, unspecified trimester: Secondary | ICD-10-CM | POA: Diagnosis present

## 2021-03-09 DIAGNOSIS — Z3A39 39 weeks gestation of pregnancy: Secondary | ICD-10-CM | POA: Diagnosis not present

## 2021-03-09 DIAGNOSIS — Z3480 Encounter for supervision of other normal pregnancy, unspecified trimester: Secondary | ICD-10-CM

## 2021-03-09 DIAGNOSIS — O9902 Anemia complicating childbirth: Secondary | ICD-10-CM | POA: Diagnosis present

## 2021-03-09 LAB — CBC
HCT: 32.5 % — ABNORMAL LOW (ref 36.0–46.0)
Hemoglobin: 10.5 g/dL — ABNORMAL LOW (ref 12.0–15.0)
MCH: 29 pg (ref 26.0–34.0)
MCHC: 32.3 g/dL (ref 30.0–36.0)
MCV: 89.8 fL (ref 80.0–100.0)
Platelets: 276 10*3/uL (ref 150–400)
RBC: 3.62 MIL/uL — ABNORMAL LOW (ref 3.87–5.11)
RDW: 14.6 % (ref 11.5–15.5)
WBC: 6.1 10*3/uL (ref 4.0–10.5)
nRBC: 0 % (ref 0.0–0.2)

## 2021-03-09 LAB — TYPE AND SCREEN
ABO/RH(D): O POS
Antibody Screen: NEGATIVE

## 2021-03-09 LAB — GLUCOSE, CAPILLARY
Glucose-Capillary: 114 mg/dL — ABNORMAL HIGH (ref 70–99)
Glucose-Capillary: 71 mg/dL (ref 70–99)

## 2021-03-09 LAB — GLUCOSE, RANDOM: Glucose, Bld: 92 mg/dL (ref 70–99)

## 2021-03-09 MED ORDER — TERBUTALINE SULFATE 1 MG/ML IJ SOLN: 0.2500 mg | Freq: Once | INTRAMUSCULAR | Status: DC | PRN

## 2021-03-09 MED ORDER — PHENYLEPHRINE 40 MCG/ML (10ML) SYRINGE FOR IV PUSH (FOR BLOOD PRESSURE SUPPORT)
80.0000 ug | PREFILLED_SYRINGE | INTRAVENOUS | Status: DC | PRN
  Filled 2021-03-09: qty 10

## 2021-03-09 MED ORDER — DIPHENHYDRAMINE HCL 50 MG/ML IJ SOLN: 12.5000 mg | INTRAMUSCULAR | Status: DC | PRN

## 2021-03-09 MED ORDER — LIDOCAINE HCL (PF) 1 % IJ SOLN: 30.0000 mL | INTRAMUSCULAR | Status: DC | PRN

## 2021-03-09 MED ORDER — SOD CITRATE-CITRIC ACID 500-334 MG/5ML PO SOLN: 30.0000 mL | ORAL | Status: DC | PRN

## 2021-03-09 MED ORDER — LACTATED RINGERS IV SOLN
500.0000 mL | INTRAVENOUS | Status: DC | PRN
  Administered 2021-03-10: 1000 mL via INTRAVENOUS

## 2021-03-09 MED ORDER — OXYTOCIN-SODIUM CHLORIDE 30-0.9 UT/500ML-% IV SOLN
2.5000 [IU]/h | INTRAVENOUS | Status: DC
  Administered 2021-03-10: 2.5 [IU]/h via INTRAVENOUS

## 2021-03-09 MED ORDER — OXYCODONE-ACETAMINOPHEN 5-325 MG PO TABS: 1.0000 | ORAL_TABLET | ORAL | Status: DC | PRN

## 2021-03-09 MED ORDER — OXYTOCIN BOLUS FROM INFUSION
333.0000 mL | Freq: Once | INTRAVENOUS | Status: AC
  Administered 2021-03-10: 333 mL via INTRAVENOUS

## 2021-03-09 MED ORDER — ONDANSETRON HCL 4 MG/2ML IJ SOLN
4.0000 mg | Freq: Four times a day (QID) | INTRAMUSCULAR | Status: DC | PRN
  Administered 2021-03-10: 4 mg via INTRAVENOUS
  Filled 2021-03-09: qty 2

## 2021-03-09 MED ORDER — ACETAMINOPHEN 325 MG PO TABS: 650.0000 mg | ORAL_TABLET | ORAL | Status: DC | PRN

## 2021-03-09 MED ORDER — OXYTOCIN-SODIUM CHLORIDE 30-0.9 UT/500ML-% IV SOLN
1.0000 m[IU]/min | INTRAVENOUS | Status: DC
  Administered 2021-03-09: 2 m[IU]/min via INTRAVENOUS
  Filled 2021-03-09 (×2): qty 500

## 2021-03-09 MED ORDER — LACTATED RINGERS IV SOLN: INTRAVENOUS | Status: DC

## 2021-03-09 MED ORDER — FENTANYL CITRATE (PF) 100 MCG/2ML IJ SOLN: 50.0000 ug | INTRAMUSCULAR | Status: DC | PRN

## 2021-03-09 MED ORDER — EPHEDRINE 5 MG/ML INJ: 10.0000 mg | INTRAVENOUS | Status: DC | PRN

## 2021-03-09 MED ORDER — FENTANYL-BUPIVACAINE-NACL 0.5-0.125-0.9 MG/250ML-% EP SOLN
12.0000 mL/h | EPIDURAL | Status: DC | PRN
  Administered 2021-03-10: 12 mL/h via EPIDURAL
  Filled 2021-03-09: qty 250

## 2021-03-09 MED ORDER — LACTATED RINGERS IV SOLN: 500.0000 mL | Freq: Once | INTRAVENOUS | Status: DC

## 2021-03-09 MED ORDER — PHENYLEPHRINE 40 MCG/ML (10ML) SYRINGE FOR IV PUSH (FOR BLOOD PRESSURE SUPPORT): 80.0000 ug | PREFILLED_SYRINGE | INTRAVENOUS | Status: DC | PRN

## 2021-03-09 MED ORDER — OXYCODONE-ACETAMINOPHEN 5-325 MG PO TABS: 2.0000 | ORAL_TABLET | ORAL | Status: DC | PRN

## 2021-03-09 NOTE — H&P (Signed)
OBSTETRIC ADMISSION HISTORY AND PHYSICAL  Katrina Hester is a 25 y.o. female G2P1001 with IUP at 74w0dby 8wk u/s presenting for IOL-A1GDM. She reports +FMs, No LOF, no VB, no blurry vision, headaches or peripheral edema, and RUQ pain.  She plans on bottle feeding. She declines birth control. [facility]  Dating: By 8 wk u/s --->  Estimated Date of Delivery: 03/16/21  Sono:    02/25/21@[redacted]w[redacted]d , CWD, normal anatomy, cephalic presentation, anterior placental lie, 3832g, 97% EFW   Prenatal History/Complications:  [id number] (3832g, 97%ile-->extrapolated to 4250g), pelvis proven 4020g Anemia of pregnancy (last hgb 9.9, PO iron)  Past Medical History: Past Medical History:  Diagnosis Date  . Diabetes mellitus without complication ([facility]   . Gestational diabetes     Past Surgical History: Past Surgical History:  Procedure Laterality Date  . NO PAST SURGERIES      Obstetrical History: OB History    Gravida  2   Para  1   Term  1   Preterm  0   AB  0   Living  1     SAB  0   IAB  0   Ectopic  0   Multiple  0   Live Births  1           Social History Social History   Socioeconomic History  . Marital status: Married    Spouse name: Not on file  . Number of children: Not on file  . Years of education: Not on file  . Highest education level: Not on file  Occupational History  . Not on file  Tobacco Use  . Smoking status: Never Smoker  . Smokeless tobacco: Never Used  Vaping Use  . Vaping Use: Never used  Substance and Sexual Activity  . Alcohol use: Never  . Drug use: Never  . Sexual activity: Yes    Partners: Male    Birth control/protection: Condom  Other Topics Concern  . Not on file  Social History Narrative  . Not on file   Social Determinants of Health   Financial Resource Strain: Not on file  Food Insecurity: No Food Insecurity  . Worried About RCharity fundraiserin the Last Year: Never true   . Ran Out of Food in the Last Year: Never true  Transportation Needs: No Transportation Needs  . Lack of Transportation (Medical): No  . Lack of Transportation (Non-Medical): No  Physical Activity: Not on file  Stress: Not on file  Social Connections: Not on file    Family History: Family History  Problem Relation Age of Onset  . Diabetes Neg Hx   . Hearing loss Neg Hx   . Hypertension Neg Hx   . Obesity Neg Hx     Allergies: No Known Allergies  Medications Prior to Admission  Medication Sig Dispense Refill Last Dose  . Accu-Chek Softclix Lancets lancets 100 each by Other route 4 (four) times daily. Use as instructed 100 each 12   . Blood Glucose Monitoring Suppl (ACCU-CHEK GUIDE) w/Device KIT 1 Device by Does not apply route 4 (four) times daily. 1 kit 0   . Blood Pressure Monitoring (BLOOD PRESSURE KIT) DEVI 1 Device by Does not apply route daily. ICD 10: Z34.00 1 each 0   . ferrous sulfate 325 (65 FE) MG tablet Take 1 tablet (325 mg total) by mouth daily with breakfast. Take with Vitamin C 30 tablet 1   .  glucose blood test strip Use as instructed 100 each 12   . prenatal vitamin w/FE, FA (PRENATAL 1 + 1) 27-1 MG TABS tablet Take 1 tablet by mouth daily at 12 noon.        Review of Systems   All systems reviewed and negative except as stated in HPI  Blood pressure 112/64, pulse 100, temperature 98.3 F (36.8 C), temperature source Oral, resp. rate 18, height 5' (1.524 m), weight 72.4 kg, last menstrual period 07/09/2020, unknown if currently breastfeeding. General appearance: alert, cooperative and no distress Lungs: normal respiratory effort Heart: regular rate and rhythm Abdomen: soft, non-tender; gravid Pelvic: as noted below Extremities: Homans sign is negative, no sign of DVT Presentation: cephalic by cervical exam Fetal monitoringBaseline: 150 bpm, Variability: Good {> 6 bpm), Accelerations: Reactive and Decelerations: Absent Uterine activity  irritable Dilation: 3 Effacement (%): 50 Station: -2 Exam by:: Dr. Sylvester Harder   Prenatal labs: ABO, Rh: --/--/PENDING (03/27 1406) Antibody: PENDING (03/27 1406) Rubella: Immune (09/09 0000) RPR: Non Reactive (01/07 0846)  HBsAg: Negative (09/09 0000)  HIV: Non Reactive (01/07 0846)  GBS: Negative/-- (03/07 0938)  2 hr Glucola failed Genetic screening  declined Anatomy US normal  Prenatal Transfer Tool  Maternal Diabetes: Yes:  Diabetes Type:  Diet controlled Genetic Screening: Declined Maternal Ultrasounds/Referrals: Normal Fetal Ultrasounds or other Referrals:  None Maternal Substance Abuse:  No Significant Maternal Medications:  None Significant Maternal Lab Results: Group B Strep negative  Results for orders placed or performed during the hospital encounter of 03/09/21 (from the past 24 hour(s))  Type and screen   Collection Time: 03/09/21  2:06 PM  Result Value Ref Range   ABO/RH(D) PENDING    Antibody Screen PENDING    Sample Expiration      03/12/2021,2359 Performed at [facility], [address], [place], [place] [address]     Patient Active Problem List   Diagnosis Date Noted  . DM (diabetes mellitus), gestational, diet-controlled 03/09/2021  . Anemia of pregnancy 02/11/2021  . GDM (gestational diabetes mellitus) 12/31/2020  . Supervision of other normal pregnancy, antepartum 10/22/2020    Assessment/Plan:  Katrina Hester is a 25 y.o. G2P1001 at 20w0dhere for IOL-A1GDM.  #IOL: Discussed IOL process with patient. Given cervical exam pitocin started, will continue to titrate and AROM when able. #Pain: PRN #FWB: Cat 1 #ID: GBS neg #MOF: bottle #MOC: declines #Circ: declines  #A1GDM: EFW 97%ile 3832g-->extrapolated to 4250g, pelvis proven 4020g. q4 CBG in latent labor, q2 in active labor. SD precautions at delivery.  #Anemia of pregnancy: last hgb 9.9, patient compliant with PO iron. Asymptomatic. PO vs IV iron postpartum.  AArrie Senate MD  03/09/2021, 2:44 PM

## 2021-03-09 NOTE — Progress Notes (Signed)
Labor Progress Note Katrina Hester is a 25 y.o. G2P1001 at [redacted]w[redacted]d presented for IOL-A1GDM. S: Doing well without complaints.  O:  BP 116/71   Pulse 95   Temp 98 F (36.7 C) (Oral)   Resp 18   Ht 5' (1.524 m)   Wt 72.4 kg   LMP 07/09/2020   BMI 31.19 kg/m  EFM: baseline 140bpm/mod variability/+ accels/no decels Toco: q2-5 min  CVE: Dilation: 4 Effacement (%): 50 Station: -2 Presentation: Vertex Exam by:: Dr. Germaine Pomfret   A&P: 25 y.o. G2P1001 [redacted]w[redacted]d presented for IOL-A1GDM. #IOL: Pitocin started @1500 , currently @12mL /hour. AROM attempted with this check, unsuccessful, no bulging bag noted. #Pain: PRN #FWB: cat 1 #GBS negative #A1GDM: EFW 97%ile 3832g-->extrapolated to 4250g, pelvis proven 4020g. q4 CBG in latent labor, q2 in active labor. SD precautions at delivery.  #Anemia of pregnancy: last hgb 9.9, patient compliant with PO iron. Asymptomatic. PO vs IV iron postpartum.  , MD 6:37 PM

## 2021-03-10 ENCOUNTER — Inpatient Hospital Stay (HOSPITAL_COMMUNITY): Payer: Medicaid Other | Admitting: Anesthesiology

## 2021-03-10 ENCOUNTER — Encounter (HOSPITAL_COMMUNITY): Payer: Self-pay | Admitting: Obstetrics and Gynecology

## 2021-03-10 DIAGNOSIS — O24429 Gestational diabetes mellitus in childbirth, unspecified control: Secondary | ICD-10-CM | POA: Diagnosis not present

## 2021-03-10 DIAGNOSIS — Z3A39 39 weeks gestation of pregnancy: Secondary | ICD-10-CM | POA: Diagnosis not present

## 2021-03-10 DIAGNOSIS — O24420 Gestational diabetes mellitus in childbirth, diet controlled: Secondary | ICD-10-CM | POA: Diagnosis not present

## 2021-03-10 LAB — RPR: RPR Ser Ql: NONREACTIVE

## 2021-03-10 LAB — GLUCOSE, CAPILLARY: Glucose-Capillary: 84 mg/dL (ref 70–99)

## 2021-03-10 MED ORDER — WITCH HAZEL-GLYCERIN EX PADS: 1.0000 "application " | MEDICATED_PAD | CUTANEOUS | Status: DC | PRN

## 2021-03-10 MED ORDER — FENTANYL-BUPIVACAINE-NACL 0.5-0.125-0.9 MG/250ML-% EP SOLN: 12.0000 mL/h | EPIDURAL | Status: DC | PRN

## 2021-03-10 MED ORDER — PRENATAL MULTIVITAMIN CH
1.0000 | ORAL_TABLET | Freq: Every day | ORAL | Status: DC
  Administered 2021-03-10: 1 via ORAL
  Filled 2021-03-10: qty 1

## 2021-03-10 MED ORDER — IBUPROFEN 600 MG PO TABS
600.0000 mg | ORAL_TABLET | Freq: Four times a day (QID) | ORAL | Status: DC
  Administered 2021-03-10 – 2021-03-11 (×3): 600 mg via ORAL
  Filled 2021-03-10 (×3): qty 1

## 2021-03-10 MED ORDER — LACTATED RINGERS IV SOLN: 500.0000 mL | Freq: Once | INTRAVENOUS | Status: DC

## 2021-03-10 MED ORDER — TETANUS-DIPHTH-ACELL PERTUSSIS 5-2.5-18.5 LF-MCG/0.5 IM SUSY: 0.5000 mL | PREFILLED_SYRINGE | Freq: Once | INTRAMUSCULAR | Status: DC

## 2021-03-10 MED ORDER — ONDANSETRON HCL 4 MG PO TABS: 4.0000 mg | ORAL_TABLET | ORAL | Status: DC | PRN

## 2021-03-10 MED ORDER — COCONUT OIL OIL: 1.0000 "application " | TOPICAL_OIL | Status: DC | PRN

## 2021-03-10 MED ORDER — FERROUS SULFATE 325 (65 FE) MG PO TABS
325.0000 mg | ORAL_TABLET | ORAL | Status: DC
  Administered 2021-03-10: 325 mg via ORAL
  Filled 2021-03-10: qty 1

## 2021-03-10 MED ORDER — DIBUCAINE (PERIANAL) 1 % EX OINT: 1.0000 "application " | TOPICAL_OINTMENT | CUTANEOUS | Status: DC | PRN

## 2021-03-10 MED ORDER — EPHEDRINE 5 MG/ML INJ: 10.0000 mg | INTRAVENOUS | Status: DC | PRN

## 2021-03-10 MED ORDER — SENNOSIDES-DOCUSATE SODIUM 8.6-50 MG PO TABS: 2.0000 | ORAL_TABLET | Freq: Every day | ORAL | Status: DC

## 2021-03-10 MED ORDER — BENZOCAINE-MENTHOL 20-0.5 % EX AERO
1.0000 "application " | INHALATION_SPRAY | CUTANEOUS | Status: DC | PRN
  Filled 2021-03-10: qty 56

## 2021-03-10 MED ORDER — SIMETHICONE 80 MG PO CHEW: 80.0000 mg | CHEWABLE_TABLET | ORAL | Status: DC | PRN

## 2021-03-10 MED ORDER — LIDOCAINE-EPINEPHRINE (PF) 2 %-1:200000 IJ SOLN
INTRAMUSCULAR | Status: DC | PRN
  Administered 2021-03-10: 5 mL via EPIDURAL

## 2021-03-10 MED ORDER — ACETAMINOPHEN 325 MG PO TABS
650.0000 mg | ORAL_TABLET | Freq: Four times a day (QID) | ORAL | Status: DC
  Administered 2021-03-10 – 2021-03-11 (×4): 650 mg via ORAL
  Filled 2021-03-10 (×4): qty 2

## 2021-03-10 MED ORDER — DIPHENHYDRAMINE HCL 25 MG PO CAPS: 25.0000 mg | ORAL_CAPSULE | Freq: Four times a day (QID) | ORAL | Status: DC | PRN

## 2021-03-10 MED ORDER — PHENYLEPHRINE 40 MCG/ML (10ML) SYRINGE FOR IV PUSH (FOR BLOOD PRESSURE SUPPORT): 80.0000 ug | PREFILLED_SYRINGE | INTRAVENOUS | Status: DC | PRN

## 2021-03-10 MED ORDER — ONDANSETRON HCL 4 MG/2ML IJ SOLN: 4.0000 mg | INTRAMUSCULAR | Status: DC | PRN

## 2021-03-10 NOTE — Discharge Summary (Addendum)
Postpartum Discharge Summary  Date of Service updated 03/11/21   Patient Name: Katrina Hester DOB: July 14, 1996 MRN: [id number]  Date of admission: 03/09/2021 Delivery date:03/10/2021  Delivering provider: Gifford Shave  Date of discharge: 03/11/2021  Admitting diagnosis: DM (diabetes mellitus), gestational, diet-controlled [O24.410] Intrauterine pregnancy: [redacted]w[redacted]d    Secondary diagnosis:  Active Problems:   GDM (gestational diabetes mellitus)   Anemia of pregnancy   DM (diabetes mellitus), gestational, diet-controlled   Vaginal delivery  Additional problems: as noted above   Discharge diagnosis: Term Pregnancy Delivered                                              Post partum procedures: None Augmentation: AROM and Pitocin Complications: None   Hospital course: Induction of Labor With Vaginal Delivery   25y.o. yo G2P1001 at 378w1das admitted to the hospital 03/09/2021 for induction of labor.  Indication for induction: A1 DM.  Patient had an uncomplicated labor course as follows: Membrane Rupture Time/Date: 2:47 AM ,03/10/2021   Delivery Method:Vaginal, Spontaneous  Episiotomy: None  Lacerations:  1st degree  Details of delivery can be found in separate delivery note.  Patient had a routine postpartum course. Patient is discharged home 03/11/21.  Newborn Data: Birth date:03/10/2021  Birth time:5:23 AM  Gender:Female  Living status:Living  Apgars:8 ,9  Weight:3824 g   Magnesium Sulfate received: No BMZ received: No Rhophylac:N/A MMR:N/A T-DaP:Given prenatally Flu: Yes Transfusion:No  Physical exam  Vitals:   03/10/21 0840 03/10/21 1700 03/10/21 2109 03/11/21 0540  BP: 111/69 106/71 112/65 (!) 107/57  Pulse: 70 75 61   Resp: 16 17  16   Temp: 99.1 F (37.3 C) 98.4 F (36.9 C) 98.4 F (36.9 C) 98 F (36.7 C)  TempSrc: Oral Oral Oral Oral  SpO2: 100% 100% 100% 100%  Weight:      Height:       General: alert and cooperative Lochia: appropriate Uterine  Fundus: firm Incision: N/A DVT Evaluation: No evidence of DVT seen on physical exam. Labs: Lab Results  Component Value Date   WBC 6.1 03/09/2021   HGB 10.5 (L) 03/09/2021   HCT 32.5 (L) 03/09/2021   MCV 89.8 03/09/2021   PLT 276 03/09/2021   CMP Latest Ref Rng & Units 03/09/2021  Glucose 70 - 99 mg/dL 92   Edinburgh Score: Edinburgh Postnatal Depression Scale Screening Tool 03/10/2021  I have been able to laugh and see the funny side of things. 0  I have looked forward with enjoyment to things. 0  I have blamed myself unnecessarily when things went wrong. 0  I have been anxious or worried for no good reason. 0  I have felt scared or panicky for no good reason. 0  Things have been getting on top of me. 0  I have been so unhappy that I have had difficulty sleeping. 0  I have felt sad or miserable. 0  I have been so unhappy that I have been crying. 0  The thought of harming myself has occurred to me. 0  Edinburgh Postnatal Depression Scale Total 0     After visit meds:  Allergies as of 03/11/2021   No Known Allergies     Medication List    STOP taking these medications   ferrous sulfate 325 (65 FE) MG tablet     TAKE these medications  Accu-Chek Guide w/Device Kit 1 Device by Does not apply route 4 (four) times daily.   Accu-Chek Softclix Lancets lancets 100 each by Other route 4 (four) times daily. Use as instructed   Blood Pressure Kit Devi 1 Device by Does not apply route daily. ICD 10: Z34.00   glucose blood test strip Use as instructed   ibuprofen 600 MG tablet Commonly known as: ADVIL Take 1 tablet (600 mg total) by mouth every 6 (six) hours.   prenatal multivitamin Tabs tablet Take 1 tablet by mouth daily at 12 noon.        Discharge home in stable condition Infant Feeding: Breast Infant Disposition:home with mother Discharge instruction: per After Visit Summary and Postpartum booklet. Activity: Advance as tolerated. Pelvic rest for 6 weeks.   Diet: routine diet Future Appointments: Future Appointments  Date Time Provider [place]  04/21/2021  8:50 AM [facility]  04/21/2021  9:15 AM Aletha Halim, MD [facility]   Follow up Visit:  Message sent to [facility] clinic by Dr. Astrid Drafts  Please schedule this patient for a In person postpartum visit in 6 weeks with the following provider: Any provider. Additional Postpartum F/U:2 hour GTT  Low risk pregnancy complicated by: Z1IRC (discharge FBG 73) Delivery mode:  Vaginal, Spontaneous  Anticipated Birth Control:  Unsure   03/11/2021 Brooks Sailors, MD  Attestation of Supervision of Student:  I confirm that I have verified the information documented in the resident's  note and that I have also personally reperformed the history, physical exam and all medical decision making activities.  I have verified that all services and findings are accurately documented in this student's note; and I agree with management and plan as outlined in the documentation. I have also made any necessary editorial changes.  Randa Ngo, [place] for [facility], [place] [facility] 03/11/2021 9:18 AM

## 2021-03-10 NOTE — Anesthesia Preprocedure Evaluation (Signed)
Anesthesia Evaluation  Patient identified by MRN, date of birth, ID band Patient awake    Reviewed: Allergy & Precautions, Patient's Chart, lab work & pertinent test results  Airway Mallampati: II       Dental no notable dental hx.    Pulmonary    Pulmonary exam normal        Cardiovascular Normal cardiovascular exam     Neuro/Psych    GI/Hepatic   Endo/Other  diabetes, Gestational  Renal/GU      Musculoskeletal   Abdominal   Peds  Hematology   Anesthesia Other Findings   Reproductive/Obstetrics (+) Pregnancy                             Anesthesia Physical Anesthesia Plan  ASA: II  Anesthesia Plan: Epidural   Post-op Pain Management:    Induction:   PONV Risk Score and Plan: 0  Airway Management Planned: Natural Airway  Additional Equipment: None  Intra-op Plan:   Post-operative Plan:   Informed Consent: I have reviewed the patients History and Physical, chart, labs and discussed the procedure including the risks, benefits and alternatives for the proposed anesthesia with the patient or authorized representative who has indicated his/her understanding and acceptance.       Plan Discussed with:   Anesthesia Plan Comments: (Lab Results      Component                Value               Date                      WBC                      6.1                 03/09/2021                HGB                      10.5 (L)            03/09/2021                HCT                      32.5 (L)            03/09/2021                MCV                      89.8                03/09/2021                PLT                      276                 03/09/2021           )        Anesthesia Quick Evaluation

## 2021-03-10 NOTE — Anesthesia Postprocedure Evaluation (Signed)
Anesthesia Post Note  Patient: Alysabeth Scalia  Procedure(s) Performed: AN AD HOC LABOR EPIDURAL     Patient location during evaluation: Mother Baby Anesthesia Type: Epidural Level of consciousness: awake and alert Pain management: pain level controlled Vital Signs Assessment: post-procedure vital signs reviewed and stable Respiratory status: spontaneous breathing, nonlabored ventilation and respiratory function stable Cardiovascular status: stable Postop Assessment: no headache, no backache, epidural receding, adequate PO intake, no apparent nausea or vomiting, able to ambulate and patient able to bend at knees Anesthetic complications: no   No complications documented.  Last Vitals:  Vitals:   03/10/21 0738 03/10/21 0840  BP: 120/72 111/69  Pulse: 67 70  Resp: 19 16  Temp: 37.3 C 37.3 C  SpO2: 100% 100%    Last Pain:  Vitals:   03/10/21 0840  TempSrc: Oral  PainSc:    Pain Goal:                   Blythe Stanford

## 2021-03-10 NOTE — Anesthesia Procedure Notes (Signed)
Epidural Patient location during procedure: OB Start time: 03/10/2021 2:18 AM End time: 03/10/2021 2:24 AM  Staffing Anesthesiologist: Shelton Silvas, MD Performed: anesthesiologist   Preanesthetic Checklist Completed: patient identified, IV checked, site marked, risks and benefits discussed, surgical consent, monitors and equipment checked, pre-op evaluation and timeout performed  Epidural Patient position: sitting Prep: DuraPrep Patient monitoring: heart rate, continuous pulse ox and blood pressure Approach: midline Location: L3-L4 Injection technique: LOR saline  Needle:  Needle type: Tuohy  Needle gauge: 17 G Needle length: 9 cm Catheter type: closed end flexible Catheter size: 20 Guage Test dose: negative and 1.5% lidocaine  Assessment Events: blood not aspirated, injection not painful, no injection resistance and no paresthesia  Additional Notes LOR @ 4  Patient identified. Risks/Benefits/Options discussed with patient including but not limited to bleeding, infection, nerve damage, paralysis, failed block, incomplete pain control, headache, blood pressure changes, nausea, vomiting, reactions to medications, itching and postpartum back pain. Confirmed with bedside nurse the patient's most recent platelet count. Confirmed with patient that they are not currently taking any anticoagulation, have any bleeding history or any family history of bleeding disorders. Patient expressed understanding and wished to proceed. All questions were answered. Sterile technique was used throughout the entire procedure. Please see nursing notes for vital signs. Test dose was given through epidural catheter and negative prior to continuing to dose epidural or start infusion. Warning signs of high block given to the patient including shortness of breath, tingling/numbness in hands, complete motor block, or any concerning symptoms with instructions to call for help. Patient was given instructions on  fall risk and not to get out of bed. All questions and concerns addressed with instructions to call with any issues or inadequate analgesia.    Reason for block:procedure for pain

## 2021-03-10 NOTE — Progress Notes (Signed)
Labor Progress Note Katrina Hester is a 25 y.o. G2P1001 at [redacted]w[redacted]d presented for IOL-A1GDM. S: Recently received epidural. Feeling more comfortable but still feeling considerable pressure. .   O:  BP 126/73   Pulse 93   Temp 98.4 F (36.9 C) (Oral)   Resp 18   Ht 5' (1.524 m)   Wt 72.4 kg   LMP 07/09/2020   SpO2 100%   BMI 31.19 kg/m  EFM: baseline 135 bpm/mod variability/+ accels/no decels Toco: q1-2 min  CVE: Dilation: 6.5 Effacement (%): 90 Station: -1 Presentation: Vertex Exam by:: [name]   A&P: 25 y.o. G2P1001 [redacted]w[redacted]d presented for IOL-A1GDM. #IOL: Pitocin started @1500 , currently @18mL /hour. AROM @0245 .  #Pain: PRN #FWB: cat 1 #GBS negative #A1GDM: EFW 97%ile 3832g-->extrapolated to 4250g, pelvis proven 4020g. q4 CBG in latent labor, q2 in active labor. SD precautions at delivery.  #Anemia of pregnancy: last hgb 9.9, patient compliant with PO iron. Asymptomatic. PO vs IV iron postpartum.  , MD 2:52 AM

## 2021-03-10 NOTE — Progress Notes (Signed)
Labor Progress Note Katrina Hester is a 25 y.o. G2P1001 at [redacted]w[redacted]d presented for IOL-A1GDM. S: Doing well at this time with no current concerns. Reports feeling increased pressure.   O:  BP (!) 99/54   Pulse 80   Temp 98.4 F (36.9 C) (Oral)   Resp 20   Ht 5' (1.524 m)   Wt 72.4 kg   LMP 07/09/2020   SpO2 100%   BMI 31.19 kg/m  EFM: baseline 150 bpm/mod variability/+ accels/no decels Toco: q2-3 min  CVE: Dilation: 5.5 Effacement (%): 80 Station: -2 Presentation: Vertex Exam by:: [name]   A&P: 25 y.o. G2P1001 [redacted]w[redacted]d presented for IOL-A1GDM. #IOL: Pitocin started @1500 , currently @16mL /hour. AROM not attempted at this check but plan on it at nex check. Continue to titrate pitocin as able.  #Pain: PRN #FWB: cat 1 #GBS negative #A1GDM: EFW 97%ile 3832g-->extrapolated to 4250g, pelvis proven 4020g. q4 CBG in latent labor, q2 in active labor. SD precautions at delivery.  #Anemia of pregnancy: last hgb 9.9, patient compliant with PO iron. Asymptomatic. PO vs IV iron postpartum.  , MD 12:44 AM

## 2021-03-11 ENCOUNTER — Encounter (HOSPITAL_COMMUNITY): Payer: Medicaid Other

## 2021-03-11 LAB — GLUCOSE, CAPILLARY: Glucose-Capillary: 72 mg/dL (ref 70–99)

## 2021-03-11 MED ORDER — IBUPROFEN 600 MG PO TABS: 600.0000 mg | ORAL_TABLET | Freq: Four times a day (QID) | ORAL | 0 refills | Status: AC

## 2021-03-12 ENCOUNTER — Telehealth: Payer: Self-pay

## 2021-03-12 NOTE — Telephone Encounter (Signed)
Transition Care Management Follow-up Telephone Call  Date of discharge and from where: 03/11/2021 from [facility]   How have you been since you were released from the hospital? Pt stated that she if feeling very well and has no questions or concerns at this time.   Any questions or concerns? No  Items Reviewed:  Did the pt receive and understand the discharge instructions provided? Yes   Medications obtained and verified? Yes   Other? No   Any new allergies since your discharge? No   Dietary orders reviewed? n/a  Do you have support at home? Yes   Functional Questionnaire: (I = Indepen/dent and D = Dependent) ADLs: I  Bathing/Dressing- I  Meal Prep- I  Eating- I  Maintaining continence- I  Transferring/Ambulation- I  Managing Meds- I   Follow up appointments reviewed:   PCP Hospital f/u appt confirmed? No    Specialist Hospital f/u appt confirmed? Yes  Scheduled to see Frisco Bing, MD on 04/21/2021 @ 09:15am.  Are transportation arrangements needed? No   If their condition worsens, is the pt aware to call PCP or go to the Emergency Dept.? Yes  Was the patient provided with contact information for the PCP's office or ED? Yes  Was to pt encouraged to call back with questions or concerns? Yes

## 2021-04-02 ENCOUNTER — Encounter: Payer: Medicaid Other | Admitting: Certified Nurse Midwife

## 2021-04-02 ENCOUNTER — Other Ambulatory Visit: Payer: Medicaid Other

## 2021-04-07 IMAGING — US US OB COMP +14 WK
1 series · 13 of 28 positions shown · non-contrast
Comparison: none

CLINICAL DATA: Second trimester pregnancy for fetal anatomy survey.

EXAM:
OBSTETRICAL ULTRASOUND >14 WKS

[Series 1: us ob comp +14 wk · 0.23mm/px · 13 of 73 slices shown]
[im 3/73]
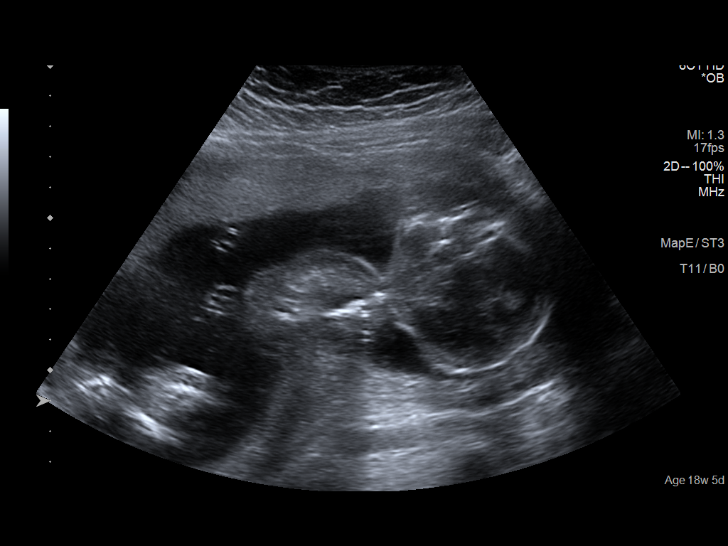
[im 9/73]
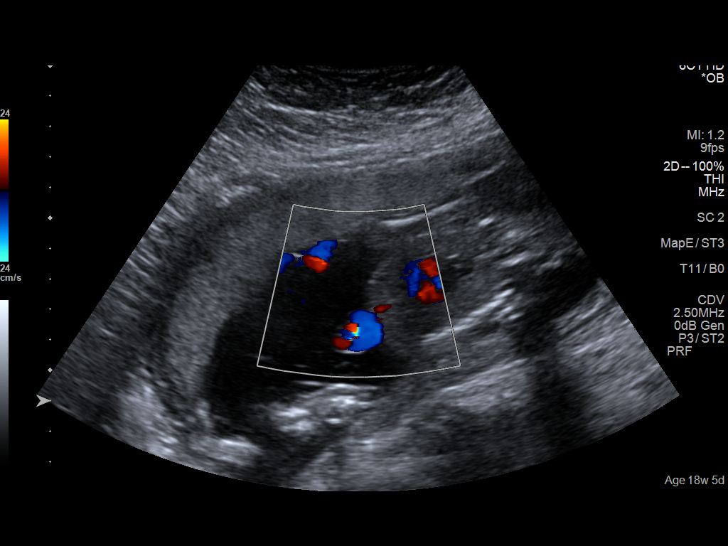
[im 14/73]
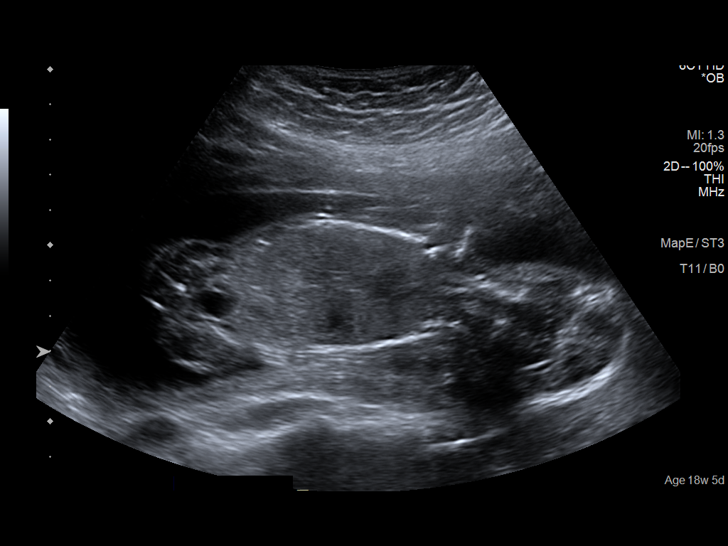
[im 19/73]
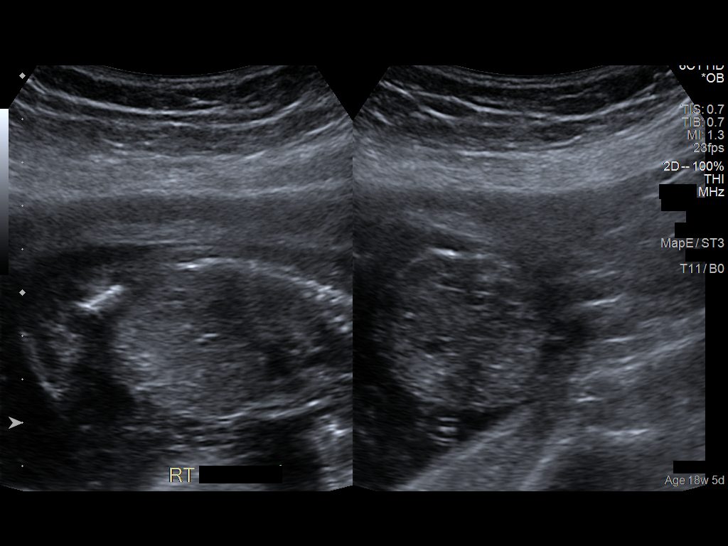
[im 25/73]
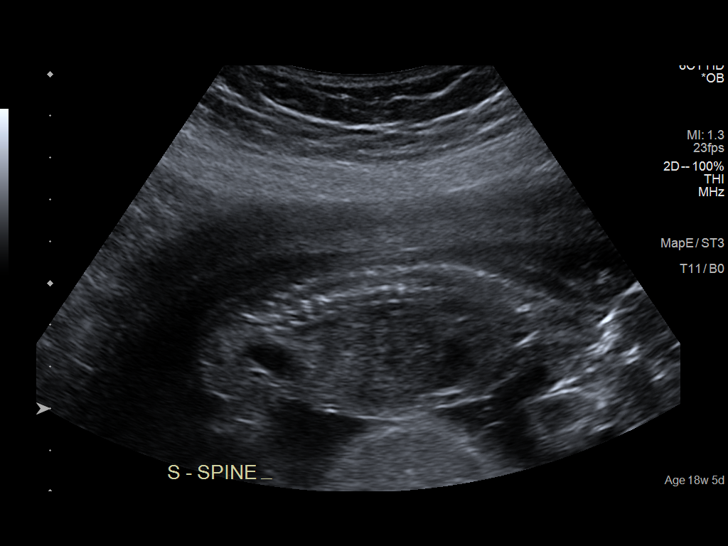
[im 30/73]
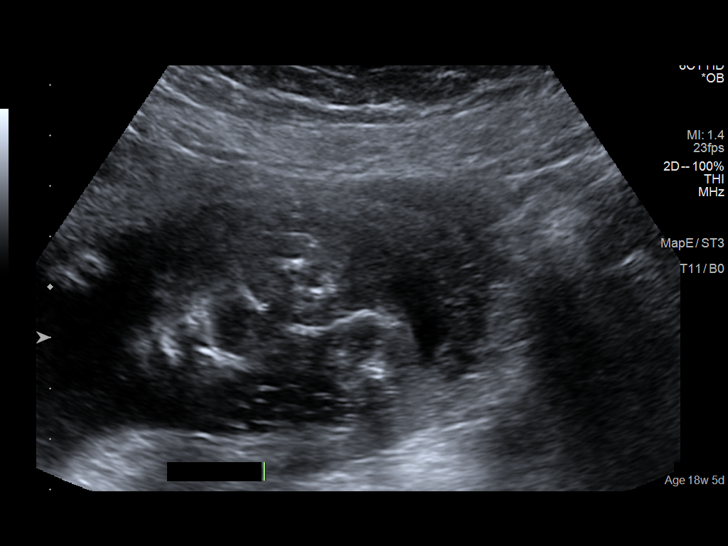
[im 38/73]
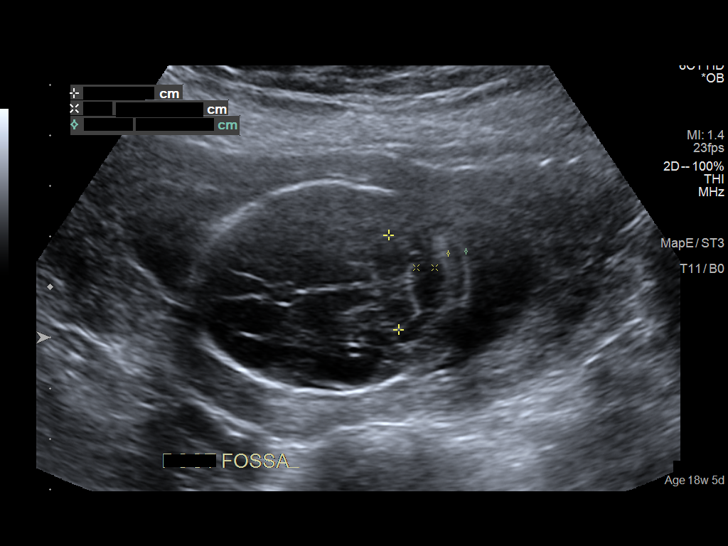
[im 43/73]
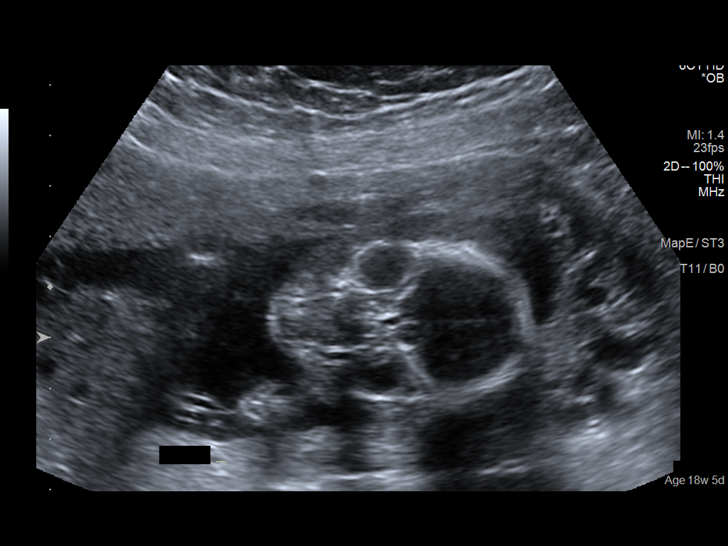
[im 49/73]
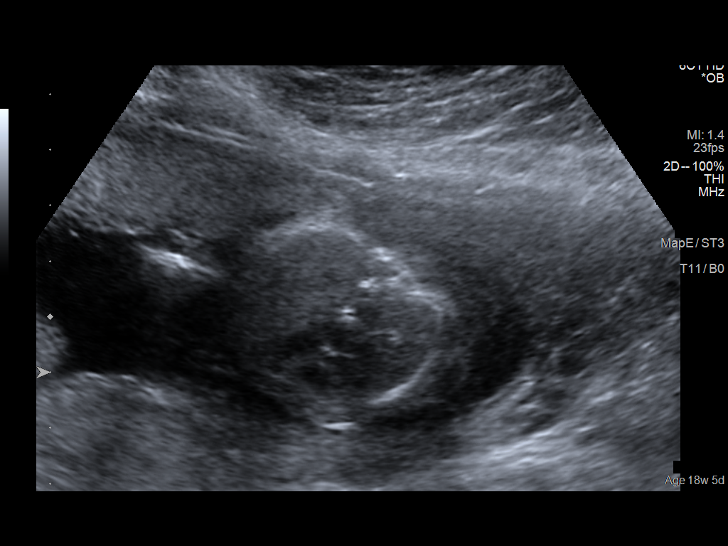
[im 54/73]
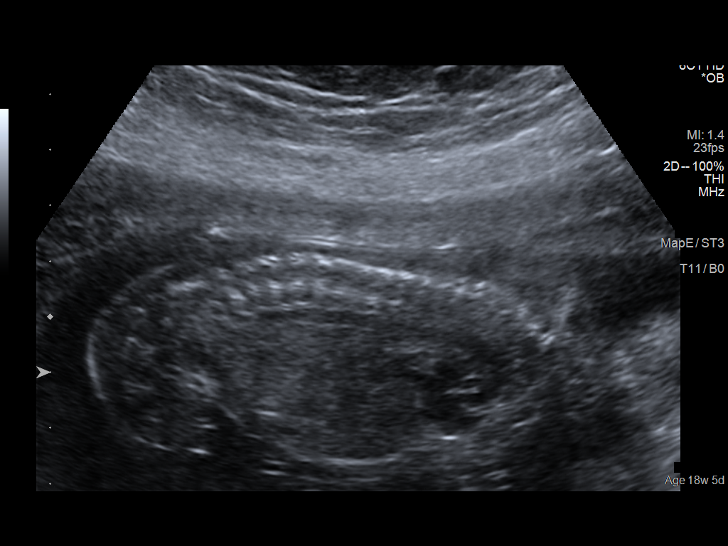
[im 59/73]
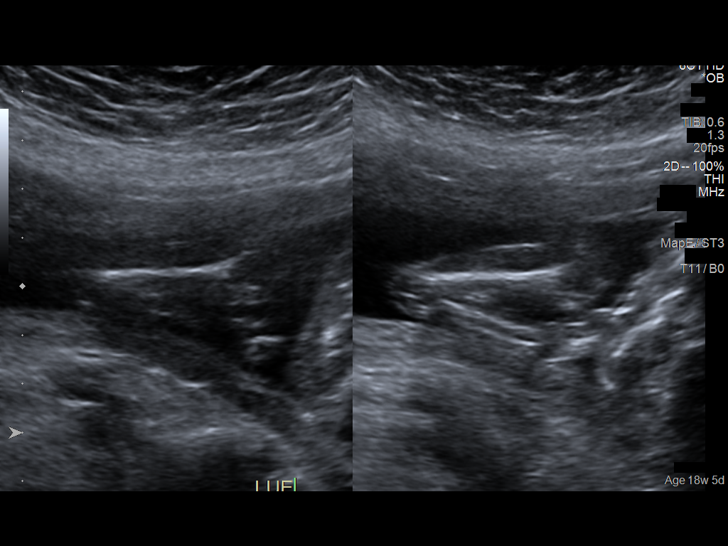
[im 65/73]
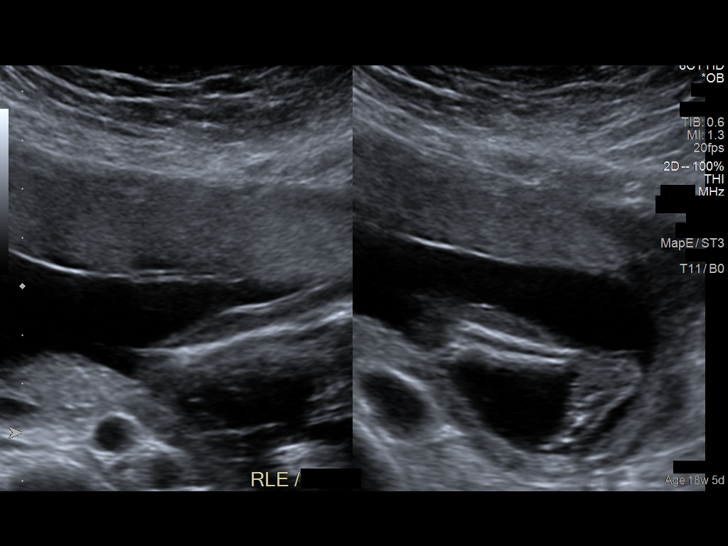
[im 70/73]
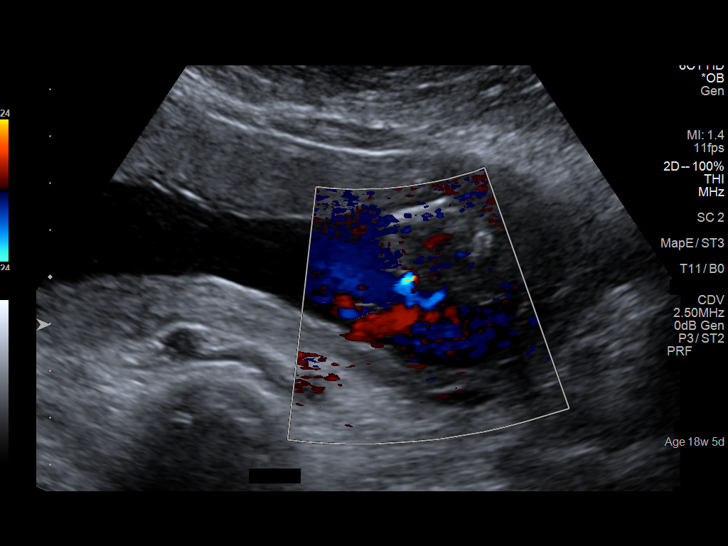

[13 of 28 positions shown; findings below may reference images not displayed]

FINDINGS: Number of Fetuses: 1

Heart Rate:  133 bpm

Movement: Yes

Presentation: Cephalic

Previa: No

Placental Location: Anterior

Amniotic Fluid (Subjective): Within normal limits

Amniotic Fluid (Objective):

Vertical pocket = 4.0cm

FETAL BIOMETRY

BPD: 4.2cm 18w 5d

HC:   16.0cm 18w 6d

AC:   13.3cm 18w 6d

FL:   2.9cm 18w 5d

Current Mean GA: 18w 6d US EDC: 03/15/2021

Assigned GA:  18w 5d Assigned EDC: 03/16/2021

FETAL ANATOMY

Lateral Ventricles: Appears normal

Thalami/CSP: Appears normal

Posterior Fossa:  Appears normal

Nuchal Region: Appears normal   NFT= 3.5 mm

Upper Lip: Appears normal

Spine: Appears normal

4 Chamber Heart on Left: Appears normal

LVOT: Appears normal

RVOT: Appears normal

Stomach on Left: Appears normal

3 Vessel Cord: Appears normal

Cord Insertion site: Appears normal

Kidneys: Appears normal

Bladder: Appears normal

Extremities: Appears normal

Sex: Male

Technically difficult due to: Fetal position

Maternal Findings:

Cervix:  4.8 cm TA
IMPRESSION: Assigned GA currently 18 weeks 5 days.  Appropriate fetal growth.

Unremarkable fetal anatomic survey.  No fetal anomalies identified.

## 2021-04-17 ENCOUNTER — Other Ambulatory Visit: Payer: Self-pay | Admitting: *Deleted

## 2021-04-17 DIAGNOSIS — O24429 Gestational diabetes mellitus in childbirth, unspecified control: Secondary | ICD-10-CM

## 2021-04-21 ENCOUNTER — Encounter: Payer: Self-pay | Admitting: Obstetrics and Gynecology

## 2021-04-21 ENCOUNTER — Ambulatory Visit: Payer: Medicaid Other | Admitting: Obstetrics and Gynecology

## 2021-04-21 ENCOUNTER — Other Ambulatory Visit: Payer: Medicaid Other

## 2021-04-23 NOTE — Progress Notes (Signed)
Patient did not keep her postpartum and GTT appointment for 04/21/2021.  Cornelia Copa MD Attending Center for [employer])

## 2021-05-21 ENCOUNTER — Telehealth: Payer: Self-pay

## 2021-05-21 NOTE — Telephone Encounter (Signed)
[employer] called in needing office notes from patients visit on 10/22/2020 it can be faxed to [phone]. States they sent over a request and they only received a fax back stating that the patient was seen in our office but they are needing OV noted from that visit 10/22/2021   Please advise
